# Patient Record
Sex: Female | Born: 1960 | Race: White | Hispanic: No | Marital: Married | Smoking: Never smoker
Health system: Southern US, Community
[De-identification: ages and names within clinical notes are randomized; demographics above are authoritative.]

## PROBLEM LIST (undated history)

## (undated) DIAGNOSIS — R011 Cardiac murmur, unspecified: Secondary | ICD-10-CM

## (undated) DIAGNOSIS — I10 Essential (primary) hypertension: Secondary | ICD-10-CM

## (undated) DIAGNOSIS — F329 Major depressive disorder, single episode, unspecified: Secondary | ICD-10-CM

## (undated) DIAGNOSIS — F419 Anxiety disorder, unspecified: Secondary | ICD-10-CM

## (undated) DIAGNOSIS — M1990 Unspecified osteoarthritis, unspecified site: Secondary | ICD-10-CM

## (undated) DIAGNOSIS — I341 Nonrheumatic mitral (valve) prolapse: Secondary | ICD-10-CM

## (undated) DIAGNOSIS — K219 Gastro-esophageal reflux disease without esophagitis: Secondary | ICD-10-CM

## (undated) DIAGNOSIS — Z8669 Personal history of other diseases of the nervous system and sense organs: Secondary | ICD-10-CM

## (undated) DIAGNOSIS — R42 Dizziness and giddiness: Secondary | ICD-10-CM

## (undated) DIAGNOSIS — T8859XA Other complications of anesthesia, initial encounter: Secondary | ICD-10-CM

## (undated) HISTORY — DX: Major depressive disorder, single episode, unspecified: F32.9

## (undated) HISTORY — PX: HAND SURGERY: SHX662

## (undated) HISTORY — DX: Nonrheumatic mitral (valve) prolapse: I34.1

## (undated) HISTORY — PX: TUBAL LIGATION: SHX77

## (undated) HISTORY — DX: Dizziness and giddiness: R42

## (undated) HISTORY — DX: Anxiety disorder, unspecified: F41.9

## (undated) HISTORY — DX: Personal history of other diseases of the nervous system and sense organs: Z86.69

## (undated) HISTORY — PX: KNEE ARTHROSCOPY: SUR90

---

## 1998-01-02 ENCOUNTER — Emergency Department (HOSPITAL_COMMUNITY): Admission: EM | Admit: 1998-01-02 | Discharge: 1998-01-02 | Payer: Self-pay | Admitting: Emergency Medicine

## 1998-01-03 ENCOUNTER — Encounter: Payer: Self-pay | Admitting: Orthopedic Surgery

## 1998-01-03 ENCOUNTER — Ambulatory Visit (HOSPITAL_BASED_OUTPATIENT_CLINIC_OR_DEPARTMENT_OTHER): Admission: RE | Admit: 1998-01-03 | Discharge: 1998-01-03 | Payer: Self-pay | Admitting: Orthopedic Surgery

## 1999-07-23 ENCOUNTER — Encounter: Payer: Self-pay | Admitting: Emergency Medicine

## 1999-07-23 ENCOUNTER — Emergency Department (HOSPITAL_COMMUNITY): Admission: EM | Admit: 1999-07-23 | Discharge: 1999-07-23 | Payer: Self-pay | Admitting: Emergency Medicine

## 1999-10-08 ENCOUNTER — Encounter: Admission: RE | Admit: 1999-10-08 | Discharge: 1999-10-08 | Payer: Self-pay | Admitting: Family Medicine

## 1999-10-08 ENCOUNTER — Encounter: Payer: Self-pay | Admitting: Family Medicine

## 1999-11-14 ENCOUNTER — Ambulatory Visit (HOSPITAL_BASED_OUTPATIENT_CLINIC_OR_DEPARTMENT_OTHER): Admission: RE | Admit: 1999-11-14 | Discharge: 1999-11-15 | Payer: Self-pay | Admitting: Orthopedic Surgery

## 2003-08-10 ENCOUNTER — Other Ambulatory Visit: Admission: RE | Admit: 2003-08-10 | Discharge: 2003-08-10 | Payer: Self-pay | Admitting: Family Medicine

## 2004-10-11 ENCOUNTER — Encounter: Admission: RE | Admit: 2004-10-11 | Discharge: 2004-10-11 | Payer: Self-pay | Admitting: Family Medicine

## 2005-10-12 ENCOUNTER — Emergency Department (HOSPITAL_COMMUNITY): Admission: EM | Admit: 2005-10-12 | Discharge: 2005-10-12 | Payer: Self-pay | Admitting: Emergency Medicine

## 2005-11-01 ENCOUNTER — Encounter (HOSPITAL_COMMUNITY): Admission: RE | Admit: 2005-11-01 | Discharge: 2005-12-01 | Payer: Self-pay | Admitting: Orthopedic Surgery

## 2005-11-11 ENCOUNTER — Encounter: Admission: RE | Admit: 2005-11-11 | Discharge: 2005-11-11 | Payer: Self-pay | Admitting: Family Medicine

## 2005-12-03 ENCOUNTER — Encounter (HOSPITAL_COMMUNITY): Admission: RE | Admit: 2005-12-03 | Discharge: 2005-12-14 | Payer: Self-pay | Admitting: Orthopedic Surgery

## 2005-12-24 ENCOUNTER — Encounter (HOSPITAL_COMMUNITY): Admission: RE | Admit: 2005-12-24 | Discharge: 2006-01-23 | Payer: Self-pay | Admitting: Orthopedic Surgery

## 2006-01-28 ENCOUNTER — Encounter (HOSPITAL_COMMUNITY): Admission: RE | Admit: 2006-01-28 | Discharge: 2006-02-27 | Payer: Self-pay | Admitting: Orthopedic Surgery

## 2010-01-05 ENCOUNTER — Encounter: Admission: RE | Admit: 2010-01-05 | Discharge: 2010-01-05 | Payer: Self-pay | Admitting: Family Medicine

## 2010-02-21 ENCOUNTER — Other Ambulatory Visit
Admission: RE | Admit: 2010-02-21 | Discharge: 2010-02-21 | Payer: Self-pay | Source: Home / Self Care | Admitting: Family Medicine

## 2010-08-03 NOTE — Op Note (Signed)
[facility]  Patient:    [name], [name]                      MRN: [id number] Proc. Date: 11/14/99 Adm. Date:  [id number] Attending:  Colbert Ewing                           Operative Report  PREOPERATIVE DIAGNOSIS:  Anterior cruciate ligament tear with anterolateral rotary instability of left knee.  Probable lateral meniscus tear.  POSTOPERATIVE DIAGNOSIS:  Anterior cruciate ligament tear with anterolateral rotary instability of left knee.  Radial tear midportion of lateral meniscus. OPERATION PERFORMED:  Left knee examination under anesthesia, arthroscopy, partial lateral meniscectomy.  Arthroscopic endoscopic anterior cruciate ligament reconstruction, middled third patellar tendon, bone-tendon-bone autograft.  Bioabsorbable screw fixation.  Notchplasty.  SURGEON:  Loreta Ave, M.D.  ASSISTANT:  Arlys John D. Petrarca, P.A.-C.  ANESTHESIA:  General.  ESTIMATED BLOOD LOSS:  Minimal.  TOURNIQUET TIME:  One hour.  SPECIMENS:  None.  CULTURES:  None.  COMPLICATIONS:  None.  DRESSING:  Soft compressive with knee immobilizer.  DESCRIPTION OF PROCEDURE:  Patient was brought to the operating room and after adequate anesthesia had been obtained, left knee examined.  Full motion. Positive Lachman, positive drawer, positive pivot shift.  Collaterals stable. Good patellofemoral tracking.  PCL intact.  Tourniquet applied, prepped and draped in the usual sterile fashion.  Exsanguinated with elevation and Esmarch.  Tourniquet inflated to 350 mmHg.  Three portals created, one superolateral, one each medial and lateral parapatellar.  Inflow catheter introduced, knee distended arthroscope introduced and knee inspected. Articular cartilage intact throughout.  Good patellofemoral tracking.  Radial tear midportion lateral meniscus.  Saucerized out with baskets, tapered in smoothly with the shaver.  Medial meniscus had a little scuffing of  the anterior third but no significant tears and it was intact throughout. Articular cartilage, medial and lateral compartment looked good.  ACL had a complete midsubstance tear irreparable.  The ACL debrided.  Notchplasty with shaver and a high speed bur opening this up generously.  PCL intact.  The instruments and fluid were removed.  Anterior ____________ patella to tibial tubercle.  Middle third patellar tendon harvested for a 10 mm graft with bone pegs in either end utilizing oscillating saw and osteotomes.  Graft was prepared for 10 mm tunnels with #5 Ethibond placed at either end and the central portion ____________ with Vicryl.  Defect in the tendon closed with 0 Vicryl.  Arthroscope reintroduced.  Tibial guide seated on the posterior foot print of the ACL and the tibia and the other end through a small vertical incision medial to the tibial tubercle.  K-wire driven, placement found to be good and then overdrilled with a 10 mm reamer.  Debris cleared with a shaver. Femoral guide inserted across the tibial tunnel and notch on the back cortex of femur.  K-wire driven and overdrilled with a reamer for appropriate depth of the graft and pegs.  Debris cleared from all recesses.  Tunnel was assessed and found to be in good position.  A two-pin passer inserted across both tunnels and out through a stab wound in the anterolateral thigh.  Nitenol wire brought in the medial portal and out through the femoral tunnel.  Graft attached to two pin passer and pulled in across the knee seating the pegs well in the tibial and femur.  Fixed over a Nitenol wire with an 8 x 25  bioabsorbable screw in the femur.  Excellent capturing and fixation. The knee was placed in 90 degrees of flexion, posterior drawer applied.  Graft pulled tightly and fixed in the tibia with a Nitenol wire utilizing a 9 x 25 bioabsorbable screw.  At completion I had good capturing and fixation of the pegs at both ends.  Good  clearance of the graft through full motion.  Negative Lachman, negative drawer.  All Nitenol wires and sutures removed.  Wounds were all irrigated and closed with subcutaneous and subcuticular Vicryl.  Portals closed with nylon.  Margins of the wound injected with Marcaine throughout. Sterile compressive dressing applied.  Tourniquet deflated and removed. Anesthesia was then to proceed with placement of a postoperative femoral nerve block.  Once complete, knee immobilizer applied.  Anesthesia reversed. Brought to recovery room.  Tolerated surgery well.  No complications. DD:  11/14/99 TD:  11/15/99 Job: [id number]

## 2010-10-22 ENCOUNTER — Inpatient Hospital Stay (INDEPENDENT_AMBULATORY_CARE_PROVIDER_SITE_OTHER)
Admission: RE | Admit: 2010-10-22 | Discharge: 2010-10-22 | Disposition: A | Discharge status: Home / Self Care | Payer: 59 | Source: Ambulatory Visit | Attending: Emergency Medicine | Admitting: Emergency Medicine

## 2010-10-22 ENCOUNTER — Emergency Department (HOSPITAL_COMMUNITY): Payer: 59

## 2010-10-22 ENCOUNTER — Observation Stay (HOSPITAL_COMMUNITY)
Admission: EM | Admit: 2010-10-22 | Discharge: 2010-10-23 | Disposition: A | Discharge status: Home / Self Care | Payer: 59 | Attending: Internal Medicine | Admitting: Internal Medicine

## 2010-10-22 DIAGNOSIS — R079 Chest pain, unspecified: Secondary | ICD-10-CM

## 2010-10-22 DIAGNOSIS — I059 Rheumatic mitral valve disease, unspecified: Secondary | ICD-10-CM | POA: Insufficient documentation

## 2010-10-22 DIAGNOSIS — R0602 Shortness of breath: Secondary | ICD-10-CM | POA: Insufficient documentation

## 2010-10-22 DIAGNOSIS — R42 Dizziness and giddiness: Secondary | ICD-10-CM | POA: Insufficient documentation

## 2010-10-22 DIAGNOSIS — R0789 Other chest pain: Secondary | ICD-10-CM | POA: Insufficient documentation

## 2010-10-22 DIAGNOSIS — F411 Generalized anxiety disorder: Secondary | ICD-10-CM | POA: Insufficient documentation

## 2010-10-22 DIAGNOSIS — R55 Syncope and collapse: Principal | ICD-10-CM | POA: Insufficient documentation

## 2010-10-22 LAB — DIFFERENTIAL
Lymphocytes Relative: 35 % (ref 12–46)
Monocytes Absolute: 0.5 10*3/uL (ref 0.1–1.0)
Monocytes Relative: 6 % (ref 3–12)
Neutro Abs: 4.1 10*3/uL (ref 1.7–7.7)

## 2010-10-22 LAB — COMPREHENSIVE METABOLIC PANEL WITH GFR
ALT: 17 U/L (ref 0–35)
AST: 22 U/L (ref 0–37)
Albumin: 4.2 g/dL (ref 3.5–5.2)
Alkaline Phosphatase: 88 U/L (ref 39–117)
BUN: 12 mg/dL (ref 6–23)
CO2: 27 meq/L (ref 19–32)
Calcium: 9.8 mg/dL (ref 8.4–10.5)
Chloride: 104 meq/L (ref 96–112)
Creatinine, Ser: 0.56 mg/dL (ref 0.50–1.10)
GFR calc Af Amer: >60 mL/min
GFR calc non Af Amer: >60 mL/min
Glucose, Bld: 89 mg/dL (ref 70–99)
Potassium: 3.9 meq/L (ref 3.5–5.1)
Sodium: 139 meq/L (ref 135–145)
Total Bilirubin: 0.2 mg/dL — ABNORMAL LOW (ref 0.3–1.2)
Total Protein: 7.3 g/dL (ref 6.0–8.3)

## 2010-10-22 LAB — POCT I-STAT TROPONIN I

## 2010-10-22 LAB — CBC
HCT: 41.4 % (ref 36.0–46.0)
MCH: 31.4 pg (ref 26.0–34.0)
MCHC: 35.5 g/dL (ref 30.0–36.0)
Platelets: 287 10*3/uL (ref 150–400)
RBC: 4.68 MIL/uL (ref 3.87–5.11)

## 2010-10-22 LAB — TROPONIN I: Troponin I: <0.3 ng/mL

## 2010-10-22 LAB — D-DIMER, QUANTITATIVE: D-Dimer, Quant: 0.33 ug{FEU}/mL (ref 0.00–0.48)

## 2010-10-23 HISTORY — PX: TRANSTHORACIC ECHOCARDIOGRAM: SHX275

## 2010-10-23 LAB — COMPREHENSIVE METABOLIC PANEL
ALT: 13 U/L (ref 0–35)
Alkaline Phosphatase: 68 U/L (ref 39–117)
BUN: 12 mg/dL (ref 6–23)
CO2: 26 mEq/L (ref 19–32)
Chloride: 107 mEq/L (ref 96–112)
Creatinine, Ser: 0.65 mg/dL (ref 0.50–1.10)
GFR calc Af Amer: >60 mL/min (ref >60)
GFR calc non Af Amer: >60 mL/min (ref >60)
Total Bilirubin: 0.5 mg/dL (ref 0.3–1.2)
Total Protein: 6.2 g/dL (ref 6.0–8.3)

## 2010-10-23 LAB — DIFFERENTIAL
Basophils Absolute: 0 10*3/uL (ref 0.0–0.1)
Eosinophils Absolute: 0.5 10*3/uL (ref 0.0–0.7)
Eosinophils Relative: 9 % — ABNORMAL HIGH (ref 0–5)
Lymphocytes Relative: 40 % (ref 12–46)
Lymphs Abs: 2.4 10*3/uL (ref 0.7–4.0)
Monocytes Absolute: 0.5 10*3/uL (ref 0.1–1.0)
Monocytes Relative: 8 % (ref 3–12)
Neutrophils Relative %: 44 % (ref 43–77)

## 2010-10-23 LAB — CARDIAC PANEL(CRET KIN+CKTOT+MB+TROPI)
CK, MB: 3.6 ng/mL (ref 0.3–4.0)
Relative Index: INVALID (ref 0.0–2.5)
Total CK: 80 U/L (ref 7–177)
Troponin I: <0.3 ng/mL (ref <0.30)

## 2010-10-23 LAB — CBC
MCHC: 34.1 g/dL (ref 30.0–36.0)
Platelets: 264 10*3/uL (ref 150–400)
WBC: 6.1 10*3/uL (ref 4.0–10.5)

## 2010-10-23 LAB — TSH: TSH: 1.537 u[IU]/mL (ref 0.350–4.500)

## 2010-10-25 ENCOUNTER — Encounter: Payer: Self-pay | Admitting: *Deleted
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
## 2010-11-01 ENCOUNTER — Encounter: Payer: Self-pay | Admitting: Cardiology

## 2010-11-01 ENCOUNTER — Ambulatory Visit (HOSPITAL_COMMUNITY): Payer: 59 | Attending: Cardiology | Admitting: Radiology

## 2010-11-01 VITALS — Ht 62.0 in | Wt 132.0 lb

## 2010-11-01 DIAGNOSIS — R079 Chest pain, unspecified: Secondary | ICD-10-CM | POA: Insufficient documentation

## 2010-11-01 DIAGNOSIS — R0602 Shortness of breath: Secondary | ICD-10-CM

## 2010-11-01 DIAGNOSIS — R0789 Other chest pain: Secondary | ICD-10-CM

## 2010-11-01 MED ORDER — TECHNETIUM TC 99M TETROFOSMIN IV KIT
10.9000 | PACK | Freq: Once | INTRAVENOUS | Status: AC | PRN
  Administered 2010-11-01: 10.9 via INTRAVENOUS

## 2010-11-01 MED ORDER — TECHNETIUM TC 99M TETROFOSMIN IV KIT
33.0000 | PACK | Freq: Once | INTRAVENOUS | Status: AC | PRN
  Administered 2010-11-01: 33 via INTRAVENOUS

## 2010-11-01 MED ORDER — REGADENOSON 0.4 MG/5ML IV SOLN
0.4000 mg | Freq: Once | INTRAVENOUS | Status: AC
  Administered 2010-11-01: 0.4 mg via INTRAVENOUS

## 2010-11-01 NOTE — Progress Notes (Signed)
[facility] SITE 3 NUCLEAR MED [address] [place] [address] [phone]  Cardiology Nuclear Med Study  AEON KOORS is a 50 y.o. female [id number] 02-20-1961   Nuclear Med Background Indication for Stress Test:  Evaluation for Ischemia and Post Hospital-10/23/2010 CP, R/o MI  History: 10/23/10 Echo: NL EF 55-65% Cardiac Risk Factors: History of Smoking  Symptoms:  Chest Pain, Dizziness, DOE and Near Syncope   Nuclear Pre-Procedure Caffeine/Decaff Intake:  None NPO After: 7:00pm   Lungs:  clear IV 0.9% NS with Angio Cath:  20g  IV Site: R Hand  IV Started by:  Bonnita Levan, RN  Chest Size (in):  34 Cup Size: B  Height: 5\' 2"  (1.575 m)  Weight:  132 lb (59.875 kg)  BMI:  Body mass index is 24.14 kg/(m^2). Tech Comments:  N/A    Nuclear Med Study 1 or 2 day study: 1 day  Stress Test Type:  Stress  Reading MD: Kristeen Miss, MD  Order Authorizing Provider:  T.Brackbill  Resting Radionuclide: Technetium 61m Tetrofosmin  Resting Radionuclide Dose: 10.9 mCi   Stress Radionuclide:  Technetium 36m Tetrofosmin  Stress Radionuclide Dose: 33.0 mCi           Stress Protocol Rest HR: 62 Stress HR: 146  Rest BP: 120/69 Stress BP: 131/64  Exercise Time (min): 9:19 METS: 8.9   Predicted Max HR: 173 bpm % Max HR: 84.39 bpm Rate Pressure Product: 21308   Dose of Adenosine (mg):  n/a Dose of Lexiscan: n/a mg  Dose of Atropine (mg): n/a Dose of Dobutamine: n/a mcg/kg/min (at max HR)  Stress Test Technologist: Milana Na, EMT-P  Nuclear Technologist:  Domenic Polite, CNMT     Rest Procedure:  Myocardial perfusion imaging was performed at rest 45 minutes following the intravenous administration of Technetium 13m Tetrofosmin. Rest ECG: NSR  Stress Procedure:  The patient exercised for 9:19.  The patient stopped due to sob and chest tightnes.  There were no significant ST-T wave changes and rare pvcs.  Technetium 72m Tetrofosmin was injected at peak  exercise and myocardial perfusion imaging was performed after a brief delay. Stress ECG: No significant ST segment change suggestive of ischemia.  QPS Raw Data Images:  There is no interference from nuclear activity from structures below the diaphragm.  This does not affect the ability to read the study. Stress Images:  Normal homogeneous uptake in all areas of the myocardium. Rest Images:  Normal homogeneous uptake in all areas of the myocardium. Subtraction (SDS):  No evidence of ischemia. Transient Ischemic Dilatation (Normal <1.22):  1.06 Lung/Heart Ratio (Normal <0.45):  0.31  Quantitative Gated Spect Images QGS EDV:  62 ml QGS ESV:  17 ml QGS cine images:  NL LV Function; NL Wall Motion QGS EF: 72%  Impression Exercise Capacity:  Excellent exercise capacity. BP Response:  Normal blood pressure response. Clinical Symptoms:  Mild chest pain/dyspnea. ECG Impression:  No significant ST segment change suggestive of ischemia. Comparison with Prior Nuclear Study: No images to compare  Overall Impression:  Normal stress nuclear study.  No evidence of ischemia.  Normal LV function.    Vesta Mixer, Montez Hageman., MD, [facility]

## 2010-11-05 ENCOUNTER — Telehealth: Payer: Self-pay | Admitting: *Deleted

## 2010-11-05 NOTE — Telephone Encounter (Signed)
Advised of stress test results 

## 2010-11-05 NOTE — Telephone Encounter (Signed)
Message copied by Eugenia Pancoast on Mon Nov 05, 2010 12:07 PM ------      Message from: Cassell Clement      Created: Sat Nov 03, 2010  8:16 PM       Please report.  Stress test normal. [Note this is a different Ondria Lamarca---Not Nadine Counts Hollenback's wife]

## 2010-11-15 ENCOUNTER — Ambulatory Visit (INDEPENDENT_AMBULATORY_CARE_PROVIDER_SITE_OTHER): Payer: 59 | Admitting: Cardiology

## 2010-11-15 ENCOUNTER — Encounter: Payer: Self-pay | Admitting: Cardiology

## 2010-11-15 VITALS — BP 130/80 | HR 60 | Wt 134.0 lb

## 2010-11-15 DIAGNOSIS — I059 Rheumatic mitral valve disease, unspecified: Secondary | ICD-10-CM

## 2010-11-15 DIAGNOSIS — I341 Nonrheumatic mitral (valve) prolapse: Secondary | ICD-10-CM | POA: Insufficient documentation

## 2010-11-15 DIAGNOSIS — F32A Depression, unspecified: Secondary | ICD-10-CM

## 2010-11-15 DIAGNOSIS — F329 Major depressive disorder, single episode, unspecified: Secondary | ICD-10-CM | POA: Insufficient documentation

## 2010-11-15 HISTORY — DX: Depression, unspecified: F32.A

## 2010-11-15 MED ORDER — METOPROLOL TARTRATE 25 MG PO TABS: ORAL_TABLET | ORAL | Status: DC

## 2010-11-15 NOTE — Assessment & Plan Note (Signed)
The patient had a remote history of mitral valve prolapse which was confirmed during her recent hospital stay.  Although she had rather marked anterior leaflet prolapse she had only mild mitral regurgitation.

## 2010-11-15 NOTE — Progress Notes (Signed)
[name] Date of Birth:  Jan 10, 1961 [facility] [address]   [address] [place], [place]  [address] [phone]           Fax   [phone]  History of Present Illness: This pleasant 50 year old woman is seen by me for the first time in the office today.  She is a medical patient of Dr. Maurice Small.  The patient was recently hospitalized at [facility] for presyncope and dizziness and atypical chest pain.  It was felt that her presyncope was likely secondary to mild dehydration.  She did not have any evidence of myocardial injury by enzymes.  She was noted to have a prior history of mitral valve prolapse and history of anxiety.  In the hospital she had a normal chest x-ray and she had an echocardiogram which showed normal ejection fraction of 55-60% with no regional wall motion abnormalities and she had moderate to severe prolapse of the anterior mitral valve leaflets with only trace to mild mitral regurgitation.  Her left atrium was normal in size.  Following discharge the patient had an treadmill Cardiolite stress test on 11/01/10 which showed normal ejection fraction of 72% and no evidence of ischemia.  The patient walked for more than 9 minutes on the treadmill.  She comes in now for followup from a heart standpoint.The patient has had no recurrent chest pain or presyncopal episodes since discharge  Current Outpatient Prescriptions  Medication Sig Dispense Refill  . aspirin 81 MG EC tablet Take 81 mg by mouth daily.        . sertraline (ZOLOFT) 100 MG tablet Take 100 mg by mouth daily.        . metoprolol tartrate (LOPRESSOR) 25 MG tablet Take one daily as needed for palpitations or chest pain.  30 tablet  11    No Known Allergies  There is no problem list on file for this patient.   History  Smoking status  . Never Smoker   Smokeless tobacco  . Not on file    History  Alcohol Use No    No family history on file.  Review of  Systems: Constitutional: no fever chills diaphoresis or fatigue or change in weight.  Head and neck: no hearing loss, no epistaxis, no photophobia or visual disturbance. Respiratory: No cough, shortness of breath or wheezing. Cardiovascular: No chest pain peripheral edema, palpitations. Gastrointestinal: No abdominal distention, no abdominal pain, no change in bowel habits hematochezia or melena. Genitourinary: No dysuria, no frequency, no urgency, no nocturia. Musculoskeletal:No arthralgias, no back pain, no gait disturbance or myalgias. Neurological: No dizziness, no headaches, no numbness, no seizures, no syncope, no weakness, no tremors. Hematologic: No lymphadenopathy, no easy bruising. Psychiatric: No confusion, no hallucinations, no sleep disturbance.    Physical Exam: Filed Vitals:   11/15/10 1458  BP: 130/80  Pulse: 60  The general appearance reveals a well-developed well-nourished woman in no distress.Pupils equal and reactive.   Extraocular Movements are full.  There is no scleral icterus.  The mouth and pharynx are normal.  The neck is supple.  The carotids reveal no bruits.  The jugular venous pressure is normal.  The thyroid is not enlarged.  There is no lymphadenopathy.  The chest is clear to percussion and auscultation. There are no rales or rhonchi. Expansion of the chest is symmetrical.    The heart reveals no murmur gallop rub or click.  We listened in the supine, left lateral decubitus, and  upright position but I don't hear any convincing murmur of mitral valve prolapse. The abdomen is soft and nontender. Bowel sounds are normal. The liver and spleen are not enlarged. There Are no abdominal masses. There are no bruits.  The pedal pulses are good.  There is no phlebitis or edema.  There is no cyanosis or clubbing.  Strength is normal and symmetrical in all extremities.  There is no lateralizing weakness.  There are no sensory deficits.  EKG today shows normal sinus  rhythm and is within normal limits.   Assessment / Plan: My impression is that the patient does have mild mitral valve prolapse by echo with mild mitral regurgitation.  She's not having any current symptoms referable to her heart.  I don't believe that she needs to be on any daily medication for her heart.  I did give her a prescription for metoprolol 25 mg to have on hand for p.r.n. Use should she have future problems with palpitations or tachycardia or chest pain.  She is to continue under the medical care of Dr. Valentina Lucks and we will be happy to see her again p.r.n. If further cardiac problems or symptoms occur.

## 2010-11-15 NOTE — Assessment & Plan Note (Signed)
The patient has a past history of depression which has been stable on her current dose of generic Zoloft 100 mg daily

## 2010-11-16 ENCOUNTER — Encounter: Payer: Self-pay | Admitting: Cardiology
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
## 2012-06-16 ENCOUNTER — Other Ambulatory Visit: Payer: Self-pay | Admitting: Gastroenterology

## 2013-11-12 ENCOUNTER — Other Ambulatory Visit (HOSPITAL_COMMUNITY)
Admission: RE | Admit: 2013-11-12 | Discharge: 2013-11-12 | Disposition: A | Discharge status: Home / Self Care | Payer: 59 | Source: Ambulatory Visit | Attending: Family Medicine | Admitting: Family Medicine

## 2013-11-12 ENCOUNTER — Other Ambulatory Visit: Payer: Self-pay | Admitting: Family Medicine

## 2013-11-12 DIAGNOSIS — Z124 Encounter for screening for malignant neoplasm of cervix: Secondary | ICD-10-CM | POA: Diagnosis not present

## 2013-11-17 LAB — CYTOLOGY - PAP

## 2015-10-18 ENCOUNTER — Other Ambulatory Visit: Payer: Self-pay | Admitting: Radiology

## 2016-03-18 DIAGNOSIS — I471 Supraventricular tachycardia, unspecified: Secondary | ICD-10-CM

## 2016-03-18 DIAGNOSIS — R002 Palpitations: Secondary | ICD-10-CM

## 2016-03-18 HISTORY — DX: Supraventricular tachycardia, unspecified: I47.10

## 2016-03-18 HISTORY — DX: Palpitations: R00.2

## 2016-03-18 HISTORY — DX: Supraventricular tachycardia: I47.1

## 2016-04-18 DIAGNOSIS — N6322 Unspecified lump in the left breast, upper inner quadrant: Secondary | ICD-10-CM | POA: Diagnosis not present

## 2016-04-18 DIAGNOSIS — N6002 Solitary cyst of left breast: Secondary | ICD-10-CM | POA: Diagnosis not present

## 2016-07-01 DIAGNOSIS — I10 Essential (primary) hypertension: Secondary | ICD-10-CM | POA: Diagnosis not present

## 2016-07-29 DIAGNOSIS — I10 Essential (primary) hypertension: Secondary | ICD-10-CM | POA: Diagnosis not present

## 2016-10-02 ENCOUNTER — Other Ambulatory Visit: Payer: Self-pay | Admitting: Family Medicine

## 2016-10-02 ENCOUNTER — Ambulatory Visit
Admission: RE | Admit: 2016-10-02 | Discharge: 2016-10-02 | Disposition: A | Discharge status: Home / Self Care | Payer: 59 | Source: Ambulatory Visit | Attending: Family Medicine | Admitting: Family Medicine

## 2016-10-02 DIAGNOSIS — M25541 Pain in joints of right hand: Secondary | ICD-10-CM

## 2016-10-02 DIAGNOSIS — M25542 Pain in joints of left hand: Principal | ICD-10-CM

## 2016-10-02 DIAGNOSIS — M7989 Other specified soft tissue disorders: Secondary | ICD-10-CM | POA: Diagnosis not present

## 2016-10-02 DIAGNOSIS — M2550 Pain in unspecified joint: Secondary | ICD-10-CM | POA: Diagnosis not present

## 2016-10-23 DIAGNOSIS — S61512D Laceration without foreign body of left wrist, subsequent encounter: Secondary | ICD-10-CM | POA: Diagnosis not present

## 2016-10-24 DIAGNOSIS — Z09 Encounter for follow-up examination after completed treatment for conditions other than malignant neoplasm: Secondary | ICD-10-CM | POA: Diagnosis not present

## 2016-10-24 DIAGNOSIS — N6002 Solitary cyst of left breast: Secondary | ICD-10-CM | POA: Diagnosis not present

## 2016-10-24 DIAGNOSIS — N6019 Diffuse cystic mastopathy of unspecified breast: Secondary | ICD-10-CM | POA: Diagnosis not present

## 2016-11-25 DIAGNOSIS — M79642 Pain in left hand: Secondary | ICD-10-CM | POA: Diagnosis not present

## 2016-11-25 DIAGNOSIS — M79641 Pain in right hand: Secondary | ICD-10-CM | POA: Diagnosis not present

## 2016-12-10 DIAGNOSIS — G5603 Carpal tunnel syndrome, bilateral upper limbs: Secondary | ICD-10-CM | POA: Diagnosis not present

## 2016-12-10 DIAGNOSIS — M79641 Pain in right hand: Secondary | ICD-10-CM | POA: Diagnosis not present

## 2016-12-10 DIAGNOSIS — M79642 Pain in left hand: Secondary | ICD-10-CM | POA: Diagnosis not present

## 2016-12-10 DIAGNOSIS — M180 Bilateral primary osteoarthritis of first carpometacarpal joints: Secondary | ICD-10-CM | POA: Diagnosis not present

## 2016-12-13 ENCOUNTER — Other Ambulatory Visit (HOSPITAL_COMMUNITY)
Admission: RE | Admit: 2016-12-13 | Discharge: 2016-12-13 | Disposition: A | Discharge status: Home / Self Care | Payer: 59 | Source: Ambulatory Visit | Attending: Family Medicine | Admitting: Family Medicine

## 2016-12-13 ENCOUNTER — Other Ambulatory Visit: Payer: Self-pay | Admitting: Family Medicine

## 2016-12-13 DIAGNOSIS — Z124 Encounter for screening for malignant neoplasm of cervix: Secondary | ICD-10-CM | POA: Diagnosis not present

## 2016-12-13 DIAGNOSIS — I10 Essential (primary) hypertension: Secondary | ICD-10-CM | POA: Diagnosis not present

## 2016-12-13 DIAGNOSIS — M1990 Unspecified osteoarthritis, unspecified site: Secondary | ICD-10-CM | POA: Diagnosis not present

## 2016-12-13 DIAGNOSIS — N959 Unspecified menopausal and perimenopausal disorder: Secondary | ICD-10-CM | POA: Diagnosis not present

## 2016-12-13 DIAGNOSIS — G5600 Carpal tunnel syndrome, unspecified upper limb: Secondary | ICD-10-CM | POA: Diagnosis not present

## 2016-12-13 DIAGNOSIS — Z0000 Encounter for general adult medical examination without abnormal findings: Secondary | ICD-10-CM | POA: Diagnosis not present

## 2016-12-17 LAB — CYTOLOGY - PAP
Diagnosis: NEGATIVE
HPV (WINDOPATH): NOT DETECTED

## 2017-01-07 DIAGNOSIS — G5603 Carpal tunnel syndrome, bilateral upper limbs: Secondary | ICD-10-CM | POA: Diagnosis not present

## 2017-01-07 DIAGNOSIS — M79641 Pain in right hand: Secondary | ICD-10-CM | POA: Diagnosis not present

## 2017-01-07 DIAGNOSIS — M79642 Pain in left hand: Secondary | ICD-10-CM | POA: Diagnosis not present

## 2017-01-17 ENCOUNTER — Emergency Department (HOSPITAL_COMMUNITY)
Admission: EM | Admit: 2017-01-17 | Discharge: 2017-01-17 | Disposition: A | Discharge status: Home / Self Care | Payer: 59 | Attending: Emergency Medicine | Admitting: Emergency Medicine

## 2017-01-17 ENCOUNTER — Emergency Department (HOSPITAL_COMMUNITY): Payer: 59

## 2017-01-17 DIAGNOSIS — Z7982 Long term (current) use of aspirin: Secondary | ICD-10-CM | POA: Insufficient documentation

## 2017-01-17 DIAGNOSIS — Z79899 Other long term (current) drug therapy: Secondary | ICD-10-CM | POA: Diagnosis not present

## 2017-01-17 DIAGNOSIS — I471 Supraventricular tachycardia: Secondary | ICD-10-CM | POA: Insufficient documentation

## 2017-01-17 DIAGNOSIS — R079 Chest pain, unspecified: Secondary | ICD-10-CM | POA: Diagnosis not present

## 2017-01-17 DIAGNOSIS — R002 Palpitations: Secondary | ICD-10-CM | POA: Diagnosis present

## 2017-01-17 LAB — CBC WITH DIFFERENTIAL/PLATELET
BASOS ABS: 0 10*3/uL (ref 0.0–0.1)
Basophils Relative: 0 %
EOS ABS: 0.1 10*3/uL (ref 0.0–0.7)
EOS PCT: 1 %
HCT: 45.7 % (ref 36.0–46.0)
HEMOGLOBIN: 16 g/dL — AB (ref 12.0–15.0)
Lymphocytes Relative: 45 %
Lymphs Abs: 4.4 10*3/uL — ABNORMAL HIGH (ref 0.7–4.0)
MCH: 31.4 pg (ref 26.0–34.0)
MCHC: 35 g/dL (ref 30.0–36.0)
MCV: 89.6 fL (ref 78.0–100.0)
Monocytes Absolute: 0.6 10*3/uL (ref 0.1–1.0)
Monocytes Relative: 6 %
NEUTROS PCT: 48 %
Neutro Abs: 4.6 10*3/uL (ref 1.7–7.7)
PLATELETS: 300 10*3/uL (ref 150–400)
RBC: 5.1 MIL/uL (ref 3.87–5.11)
RDW: 12.4 % (ref 11.5–15.5)
WBC: 9.7 10*3/uL (ref 4.0–10.5)

## 2017-01-17 LAB — I-STAT TROPONIN, ED: Troponin i, poc: 0.01 ng/mL (ref 0.00–0.08)

## 2017-01-17 LAB — COMPREHENSIVE METABOLIC PANEL
ALK PHOS: 89 U/L (ref 38–126)
ALT: 29 U/L (ref 14–54)
AST: 40 U/L (ref 15–41)
Albumin: 4.6 g/dL (ref 3.5–5.0)
Anion gap: 12 (ref 5–15)
BUN: 12 mg/dL (ref 6–20)
CALCIUM: 10.1 mg/dL (ref 8.9–10.3)
CHLORIDE: 101 mmol/L (ref 101–111)
CO2: 23 mmol/L (ref 22–32)
CREATININE: 0.9 mg/dL (ref 0.44–1.00)
GFR calc non Af Amer: >60 mL/min (ref >60)
Glucose, Bld: 167 mg/dL — ABNORMAL HIGH (ref 65–99)
Potassium: 3.6 mmol/L (ref 3.5–5.1)
SODIUM: 136 mmol/L (ref 135–145)
Total Bilirubin: 1 mg/dL (ref 0.3–1.2)
Total Protein: 7.8 g/dL (ref 6.5–8.1)

## 2017-01-17 MED ORDER — ADENOSINE 6 MG/2ML IV SOLN
INTRAVENOUS | Status: AC
  Filled 2017-01-17: qty 6

## 2017-01-17 MED ORDER — ADENOSINE 6 MG/2ML IV SOLN
6.0000 mg | Freq: Once | INTRAVENOUS | Status: AC
  Administered 2017-01-17: 6 mg via INTRAVENOUS

## 2017-01-17 NOTE — ED Triage Notes (Signed)
Pt to ER for sudden onset of central chest pressure while walking in the park with her grandkids, EKG showing SVT @ 225 bpm. Pt reports SOB.

## 2017-01-17 NOTE — ED Notes (Signed)
Per Dr. Estell HarpinZammit, pt able to have something to drink if HR has remained stable. Pt given ice water and gingerale.

## 2017-01-17 NOTE — ED Notes (Signed)
ED Provider at bedside. 

## 2017-01-17 NOTE — ED Notes (Signed)
Pt verbalized understanding of d.c instructions, no further questions. Pt a.o, ambulatory upon d.c 

## 2017-01-17 NOTE — Discharge Instructions (Signed)
Follow-up with your doctor in 1-2 weeks.  Return if any more problems

## 2017-01-17 NOTE — ED Provider Notes (Signed)
[facility] Provider Note   CSN: [id number] Arrival date & time: 01/17/17  1119     History   Chief Complaint Chief Complaint  Patient presents with  . Chest Pain  . Tachycardia    HPI Doree FudgeLouise D Percival is a 56 y.o. female.  Patient states that she feels like her heart is racing and she feels weak   The history is provided by the patient.  Palpitations   This is a new problem. The current episode started less than 1 hour ago. The problem occurs constantly. The problem has not changed since onset.The problem is associated with an unknown factor. Pertinent negatives include no fever, no chest pain, no abdominal pain, no headaches, no back pain and no cough.    Past Medical History:  Diagnosis Date  . Anxiety   . Chest pain   . Dizziness   . History of diplopia   . MVP (mitral valve prolapse)     Patient Active Problem List   Diagnosis Date Noted  . Mitral valve prolapse 11/15/2010  . Depression 11/15/2010    Past Surgical History:  Procedure Laterality Date  . CESAREAN SECTION    . TRANSTHORACIC ECHOCARDIOGRAM  10/23/2010   EF 55-60%  . TUBAL LIGATION      OB History    No data available       Home Medications    Prior to Admission medications   Medication Sig Start Date End Date Taking? Authorizing Provider  hydrochlorothiazide (HYDRODIURIL) 12.5 MG tablet Take 12.5 mg by mouth daily. 12/09/16  Yes [provider]  meloxicam (MOBIC) 7.5 MG tablet Take 7.5 mg by mouth daily.   Yes [provider]  aspirin 81 MG EC tablet Take 81 mg by mouth daily.      [provider]  metoprolol tartrate (LOPRESSOR) 25 MG tablet Take one daily as needed for palpitations or chest pain. Patient not taking: Reported on 01/17/2017 11/15/10   Cassell ClementBrackbill, Thomas, MD  sertraline (ZOLOFT) 100 MG tablet Take 100 mg by mouth daily.      [provider]    Family History No family history on file.  Social  History Social History  Substance Use Topics  . Smoking status: Never Smoker  . Smokeless tobacco: Not on file  . Alcohol use No     Allergies   Patient has no known allergies.   Review of Systems Review of Systems  Constitutional: Negative for appetite change, fatigue and fever.  HENT: Negative for congestion, ear discharge and sinus pressure.   Eyes: Negative for discharge.  Respiratory: Negative for cough.   Cardiovascular: Positive for palpitations. Negative for chest pain.  Gastrointestinal: Negative for abdominal pain and diarrhea.  Genitourinary: Negative for frequency and hematuria.  Musculoskeletal: Negative for back pain.  Skin: Negative for rash.  Neurological: Negative for seizures and headaches.  Psychiatric/Behavioral: Negative for hallucinations.     Physical Exam Updated Vital Signs BP 116/84   Pulse 88   Resp 20   SpO2 98%   Physical Exam  Constitutional: She is oriented to person, place, and time. She appears well-developed.  HENT:  Head: Normocephalic.  Eyes: Conjunctivae and EOM are normal. No scleral icterus.  Neck: Neck supple. No thyromegaly present.  Cardiovascular: Exam reveals no gallop and no friction rub.   No murmur heard. Rapid regular rate  Pulmonary/Chest: No stridor. She has no wheezes. She has no rales. She exhibits no tenderness.  Abdominal: She exhibits no  distension. There is no tenderness. There is no rebound.  Musculoskeletal: Normal range of motion. She exhibits no edema.  Lymphadenopathy:    She has no cervical adenopathy.  Neurological: She is oriented to person, place, and time. She exhibits normal muscle tone. Coordination normal.  Skin: No rash noted. No erythema.  Psychiatric: She has a normal mood and affect. Her behavior is normal.     ED Treatments / Results  Labs (all labs ordered are listed, but only abnormal results are displayed) Labs Reviewed  CBC WITH DIFFERENTIAL/PLATELET - Abnormal; Notable for the  following:       Result Value   Hemoglobin 16.0 (*)    Lymphs Abs 4.4 (*)    All other components within normal limits  COMPREHENSIVE METABOLIC PANEL - Abnormal; Notable for the following:    Glucose, Bld 167 (*)    All other components within normal limits  I-STAT TROPONIN, ED    EKG  EKG Interpretation None       Radiology Dg Chest Port 1 View  Result Date: 01/17/2017 CLINICAL DATA:  56 year old female with a history of chest pain and tightness EXAM: PORTABLE CHEST 1 VIEW COMPARISON:  10/22/2010, 01/05/2010 FINDINGS: Cardiomediastinal silhouette unchanged in size and contour. Defibrillator pads project over the lower chest. No evidence of central vascular congestion. No interlobular septal thickening. No confluent airspace disease. No pneumothorax. No pleural effusion. IMPRESSION: No radiographic evidence of acute cardiopulmonary disease. Defibrillator pads project over the lower chest. Electronically Signed   By: Gilmer Mor D.O.   On: 01/17/2017 12:26    Procedures Procedures (including critical care time)  Medications Ordered in ED Medications  adenosine (ADENOCARD) 6 MG/2ML injection (not administered)  adenosine (ADENOCARD) 6 MG/2ML injection 6 mg (6 mg Intravenous Given 01/17/17 1138)     Initial Impression / Assessment and Plan / ED Course  I have reviewed the triage vital signs and the nursing notes.  Pertinent labs & imaging results that were available during my care of the patient were reviewed by me and considered in my medical decision making (see chart for details). CRITICAL CARE Performed by: Jaleea Alesi L Total critical care time: 35 minutes Critical care time was exclusive of separately billable procedures and treating other patients. Critical care was necessary to treat or prevent imminent or life-threatening deterioration. Critical care was time spent personally by me on the following activities: development of treatment plan with patient and/or  surrogate as well as nursing, discussions with consultants, evaluation of patient's response to treatment, examination of patient, obtaining history from patient or surrogate, ordering and performing treatments and interventions, ordering and review of laboratory studies, ordering and review of radiographic studies, pulse oximetry and re-evaluation of patient's condition.   Patient had an SVT of right greater than 200.  Patient responded to adenosine 6 mg IV.  Patient will follow up with her primary care doctor.  This is her first episode of SVT   Final Clinical Impressions(s) / ED Diagnoses   Final diagnoses:  SVT (supraventricular tachycardia) (HCC)    New Prescriptions New Prescriptions   No medications on file     Bethann Berkshire, MD 01/17/17 1521

## 2017-01-17 NOTE — ED Notes (Signed)
EDP at bedside  

## 2017-01-20 DIAGNOSIS — I471 Supraventricular tachycardia: Secondary | ICD-10-CM | POA: Diagnosis not present

## 2017-01-21 ENCOUNTER — Telehealth: Payer: Self-pay

## 2017-01-21 NOTE — Progress Notes (Signed)
Cardiology Office Note   Date:  01/23/2017   ID:  Carol FudgeLouise D Cory, DOB 06/21/1960, MRN [id number]  PCP:  Maurice SmallGriffin, Elaine, MD  Cardiologist:   Charlton HawsPeter Nishan, MD   No chief complaint on file.     History of Present Illness: [name] is a 56 y.o. female who presents for consultation regarding SVT. Referred by Dr Valentina LucksGriffin and Dr Glorianne ManchesterZamit Brewster. Last seen by Dr Patty SermonsBrackbill cardiology 2012 Had pre syncope and atypical chest pain Echo was normal EF 55-60% She had anterior leaflet prolapse but only trace MR Myovue was normal EF 72%  Seen in ER 01/17/17 with SVT HR in 200 bpm range Converted with Adenosine D/C for outpatient f/u with no med changes Some associated atypical chest pian troponin negative Hct 45.7 K 3.6 CXR NAD. No recent TSH in Epic  She was pushing grand kids in stroller when it happened Chest tightness and dyspnea only when  In SVT went away when she converted     Past Medical History:  Diagnosis Date  . Anxiety   . Chest pain   . Depression 11/15/2010  . Dizziness   . History of diplopia   . MVP (mitral valve prolapse)     Past Surgical History:  Procedure Laterality Date  . CESAREAN SECTION    . TRANSTHORACIC ECHOCARDIOGRAM  10/23/2010   EF 55-60%  . TUBAL LIGATION       Current Outpatient Medications  Medication Sig Dispense Refill  . hydrochlorothiazide (HYDRODIURIL) 12.5 MG tablet Take 12.5 mg by mouth daily.    . meloxicam (MOBIC) 7.5 MG tablet Take 7.5 mg by mouth daily.     No current facility-administered medications for this visit.     Allergies:   Patient has no known allergies.    Social History:  The patient  reports that  has never smoked. she has never used smokeless tobacco. She reports that she does not drink alcohol or use drugs.   Family History:  The patient's family history is not on file.    ROS:  Please see the history of present illness.   Otherwise, review of systems are positive for none.   All other systems are reviewed and  negative.    PHYSICAL EXAM: VS:  BP 126/82   Pulse 71   Ht 5\' 2"  (1.575 m)   Wt 139 lb 4 oz (63.2 kg)   SpO2 99%   BMI 25.47 kg/m  , BMI Body mass index is 25.47 kg/m. Affect appropriate Healthy:  appears stated age HEENT: normal Neck supple with no adenopathy JVP normal no bruits no thyromegaly Lungs clear with no wheezing and good diaphragmatic motion Heart:  S1/S2 no murmur, no rub, gallop or click PMI normal Abdomen: benighn, BS positve, no tenderness, no AAA no bruit.  No HSM or HJR Distal pulses intact with no bruits No edema Neuro non-focal Skin warm and dry No muscular weakness    EKG:  SVT vs flutter rate 222 no pre excitation 01/17/17    Recent Labs: 01/17/2017: ALT 29; BUN 12; Creatinine, Ser 0.90; Hemoglobin 16.0; Platelets 300; Potassium 3.6; Sodium 136    Lipid Panel No results found for: CHOL, TRIG, HDL, CHOLHDL, VLDL, LDLCALC, LDLDIRECT    Wt Readings from Last 3 Encounters:  01/23/17 139 lb 4 oz (63.2 kg)  11/15/10 134 lb (60.8 kg)  11/01/10 132 lb (59.9 kg)      Other studies Reviewed: Additional studies/ records that were reviewed today include: Notes  DR Patty SermonsBrackbill and echo 2012 ER notes labs ECG and CXR 01/17/17 .    ASSESSMENT AND PLAN:  1.  Tachycardia:  SVT vs flutter broke with adenosine suggests re entry and SVT. PRN Inderal 20 mg given F/U ETT and echo r/o structural heart disease and check TSH/T4 PRN f/u if recurs will need to see EP for discussion of ablation  2. MVP:  No murmur on exam history of f/u echo see #1    Current medicines are reviewed at length with the patient today.  The patient does not have concerns regarding medicines.  The following changes have been made:  PRN Inderal 20 mg   Labs/ tests ordered today include: TSH/T4  Echo  ETT  No orders of the defined types were placed in this encounter.    Disposition:   FU with EP next available      Signed, Charlton HawsPeter Nishan, MD  01/23/2017 4:46 PM    [facility] [address], [place], [place]  [address] Phone: [phone]; Fax: [phone]

## 2017-01-21 NOTE — Telephone Encounter (Signed)
Sent notes to scheduling 

## 2017-01-23 ENCOUNTER — Encounter: Payer: Self-pay | Admitting: Cardiovascular Disease

## 2017-01-23 ENCOUNTER — Ambulatory Visit: Payer: 59 | Admitting: Cardiovascular Disease

## 2017-01-23 VITALS — BP 126/82 | HR 71 | Ht 62.0 in | Wt 139.2 lb

## 2017-01-23 DIAGNOSIS — I471 Supraventricular tachycardia: Secondary | ICD-10-CM | POA: Diagnosis not present

## 2017-01-23 DIAGNOSIS — R002 Palpitations: Secondary | ICD-10-CM | POA: Diagnosis not present

## 2017-01-23 MED ORDER — PROPRANOLOL HCL 20 MG PO TABS: 20.0000 mg | ORAL_TABLET | Freq: Every day | ORAL | 3 refills | Status: DC | PRN

## 2017-01-23 NOTE — Patient Instructions (Addendum)
Medication Instructions:  Your physician has recommended you make the following change in your medication:  1-START Inderal 20 mg by mouth as needed for elevated heart over 100  Labwork: Your physician recommends that you have lab work today- TSH and T4  Testing/Procedures: Your physician has requested that you have an echocardiogram. Echocardiography is a painless test that uses sound waves to create images of your heart. It provides your doctor with information about the size and shape of your heart and how well your heart's chambers and valves are working. This procedure takes approximately one hour. There are no restrictions for this procedure.  Your physician has requested that you have an exercise tolerance test. For further information please visit [email or web address]. Please also follow instruction sheet, as given.  Follow-Up: Your physician wants you to follow-up as needed with Dr. Eden EmmsNishan.   If you need a refill on your cardiac medications before your next appointment, please call your pharmacy.

## 2017-01-24 LAB — TSH: TSH: 1.01 u[IU]/mL (ref 0.450–4.500)

## 2017-01-24 LAB — T4, FREE: Free T4: 1.47 ng/dL (ref 0.82–1.77)

## 2017-02-11 ENCOUNTER — Ambulatory Visit (HOSPITAL_COMMUNITY): Payer: 59 | Attending: Cardiology

## 2017-02-11 ENCOUNTER — Other Ambulatory Visit: Payer: Self-pay

## 2017-02-11 ENCOUNTER — Ambulatory Visit (INDEPENDENT_AMBULATORY_CARE_PROVIDER_SITE_OTHER): Payer: 59

## 2017-02-11 DIAGNOSIS — I471 Supraventricular tachycardia: Secondary | ICD-10-CM | POA: Diagnosis not present

## 2017-02-11 DIAGNOSIS — R002 Palpitations: Secondary | ICD-10-CM | POA: Diagnosis not present

## 2017-02-11 LAB — EXERCISE TOLERANCE TEST
CSEPEW: 10.1 METS
CSEPHR: 89 %
CSEPPHR: 148 {beats}/min
Exercise duration (min): 9 min
Exercise duration (sec): 0 s
MPHR: 166 {beats}/min
RPE: 17
Rest HR: 67 {beats}/min

## 2017-04-28 DIAGNOSIS — Z1231 Encounter for screening mammogram for malignant neoplasm of breast: Secondary | ICD-10-CM | POA: Diagnosis not present

## 2017-05-05 DIAGNOSIS — R922 Inconclusive mammogram: Secondary | ICD-10-CM | POA: Diagnosis not present

## 2017-05-05 DIAGNOSIS — R921 Mammographic calcification found on diagnostic imaging of breast: Secondary | ICD-10-CM | POA: Diagnosis not present

## 2017-06-16 DIAGNOSIS — I10 Essential (primary) hypertension: Secondary | ICD-10-CM | POA: Diagnosis not present

## 2017-06-16 DIAGNOSIS — M1990 Unspecified osteoarthritis, unspecified site: Secondary | ICD-10-CM | POA: Diagnosis not present

## 2017-06-16 DIAGNOSIS — I471 Supraventricular tachycardia: Secondary | ICD-10-CM | POA: Diagnosis not present

## 2017-08-01 DIAGNOSIS — J329 Chronic sinusitis, unspecified: Secondary | ICD-10-CM | POA: Diagnosis not present

## 2017-08-01 DIAGNOSIS — J309 Allergic rhinitis, unspecified: Secondary | ICD-10-CM | POA: Diagnosis not present

## 2017-09-22 DIAGNOSIS — Z86010 Personal history of colonic polyps: Secondary | ICD-10-CM | POA: Diagnosis not present

## 2017-09-22 DIAGNOSIS — K640 First degree hemorrhoids: Secondary | ICD-10-CM | POA: Diagnosis not present

## 2017-12-10 IMAGING — DX DG CHEST 1V PORT
1 series · 1 of 1 positions shown · non-contrast
Comparison: 10/22/2010, 01/05/2010

CLINICAL DATA: 56-year-old female with a history of chest pain and
tightness

EXAM:
PORTABLE CHEST 1 VIEW

[chest ap]
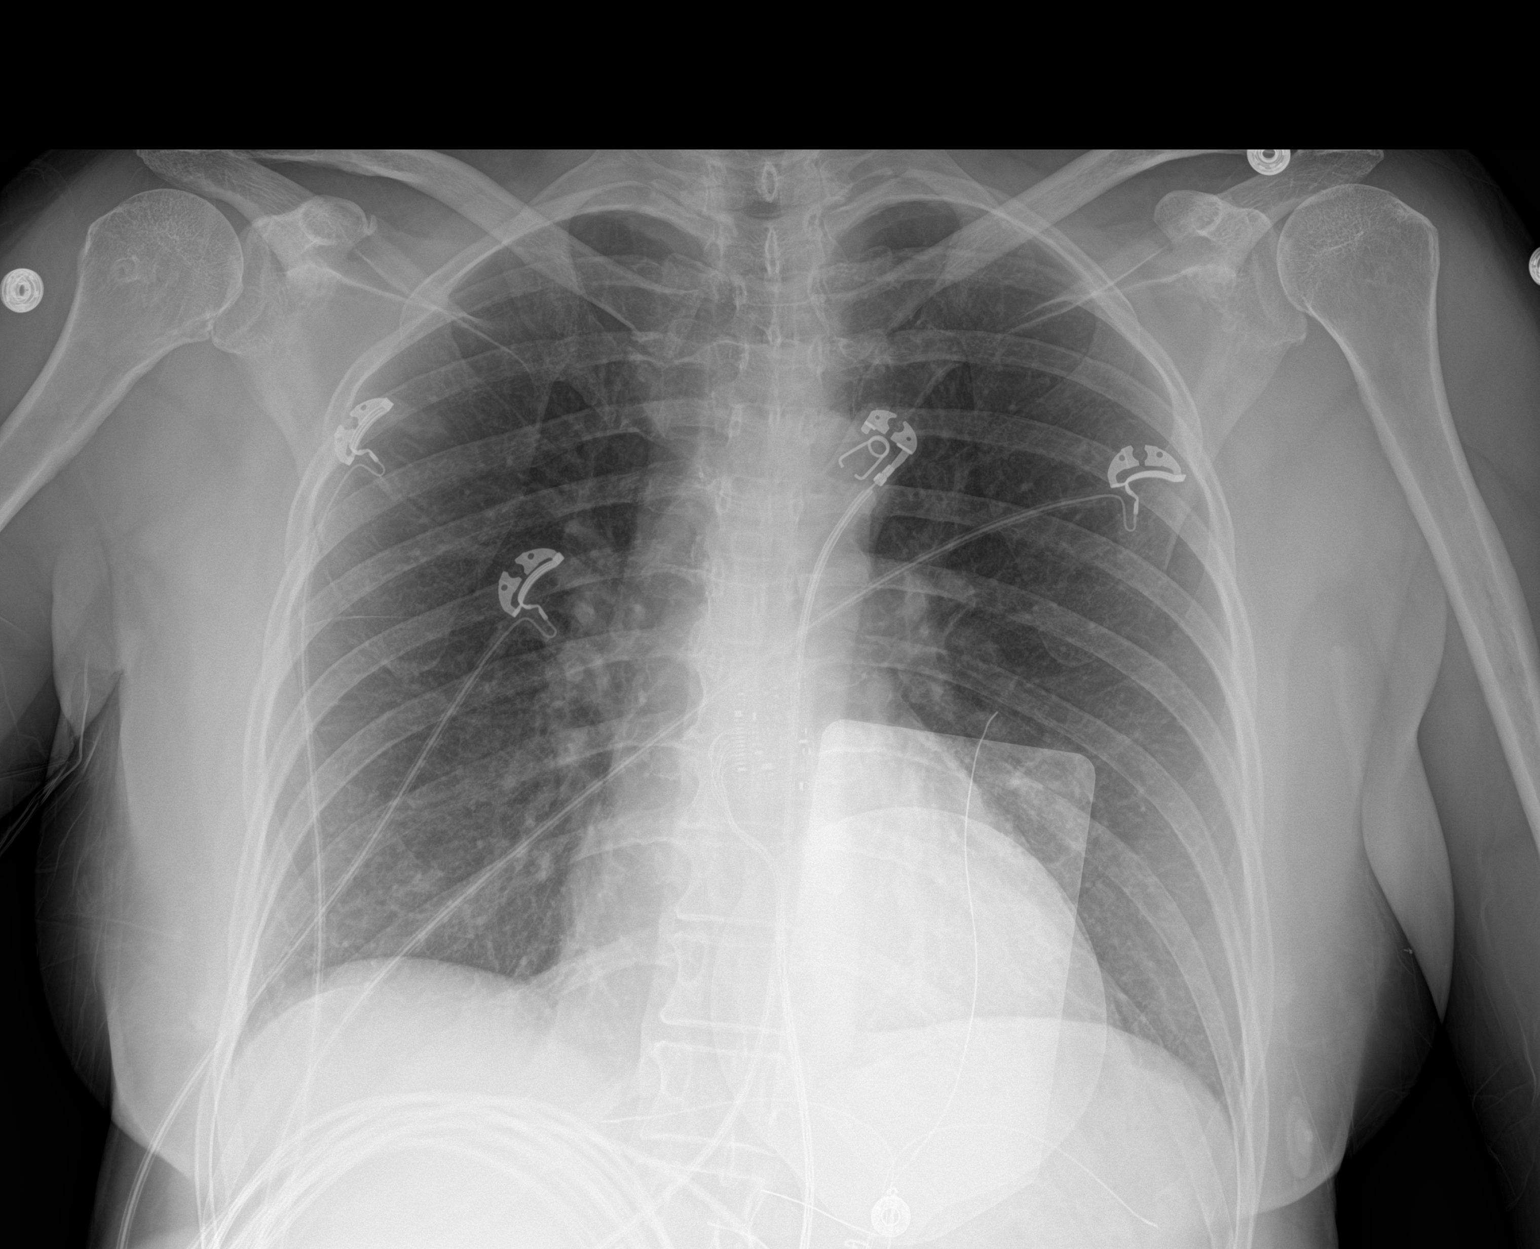

[1 of 1 positions shown; findings below may reference images not displayed]

FINDINGS: Cardiomediastinal silhouette unchanged in size and contour.
Defibrillator pads project over the lower chest.

No evidence of central vascular congestion. No interlobular septal
thickening.

No confluent airspace disease. No pneumothorax. No pleural effusion.
IMPRESSION: No radiographic evidence of acute cardiopulmonary disease.

Defibrillator pads project over the lower chest.

## 2018-01-07 DIAGNOSIS — Z23 Encounter for immunization: Secondary | ICD-10-CM | POA: Diagnosis not present

## 2018-01-07 DIAGNOSIS — Z0000 Encounter for general adult medical examination without abnormal findings: Secondary | ICD-10-CM | POA: Diagnosis not present

## 2018-01-07 DIAGNOSIS — I10 Essential (primary) hypertension: Secondary | ICD-10-CM | POA: Diagnosis not present

## 2018-01-07 DIAGNOSIS — M1990 Unspecified osteoarthritis, unspecified site: Secondary | ICD-10-CM | POA: Diagnosis not present

## 2018-05-07 DIAGNOSIS — R921 Mammographic calcification found on diagnostic imaging of breast: Secondary | ICD-10-CM | POA: Diagnosis not present

## 2018-07-15 DIAGNOSIS — I471 Supraventricular tachycardia: Secondary | ICD-10-CM | POA: Diagnosis not present

## 2018-07-15 DIAGNOSIS — I10 Essential (primary) hypertension: Secondary | ICD-10-CM | POA: Diagnosis not present

## 2018-07-15 DIAGNOSIS — M1990 Unspecified osteoarthritis, unspecified site: Secondary | ICD-10-CM | POA: Diagnosis not present

## 2019-11-03 ENCOUNTER — Other Ambulatory Visit: Payer: Self-pay | Admitting: Orthopedic Surgery

## 2019-11-03 DIAGNOSIS — M25512 Pain in left shoulder: Secondary | ICD-10-CM

## 2019-11-03 DIAGNOSIS — M4722 Other spondylosis with radiculopathy, cervical region: Secondary | ICD-10-CM

## 2019-11-03 DIAGNOSIS — M542 Cervicalgia: Secondary | ICD-10-CM

## 2019-11-03 DIAGNOSIS — M67912 Unspecified disorder of synovium and tendon, left shoulder: Secondary | ICD-10-CM

## 2019-11-28 ENCOUNTER — Ambulatory Visit
Admission: RE | Admit: 2019-11-28 | Discharge: 2019-11-28 | Disposition: A | Discharge status: Home / Self Care | Payer: 59 | Source: Ambulatory Visit | Attending: Orthopedic Surgery | Admitting: Orthopedic Surgery

## 2019-11-28 DIAGNOSIS — M25512 Pain in left shoulder: Secondary | ICD-10-CM

## 2019-11-28 DIAGNOSIS — M542 Cervicalgia: Secondary | ICD-10-CM

## 2019-11-28 DIAGNOSIS — M67912 Unspecified disorder of synovium and tendon, left shoulder: Secondary | ICD-10-CM

## 2019-11-28 DIAGNOSIS — M4722 Other spondylosis with radiculopathy, cervical region: Secondary | ICD-10-CM

## 2020-01-03 HISTORY — PX: ROTATOR CUFF REPAIR: SHX139

## 2020-01-24 ENCOUNTER — Other Ambulatory Visit: Payer: Self-pay | Admitting: Orthopedic Surgery

## 2020-02-25 ENCOUNTER — Encounter (HOSPITAL_COMMUNITY): Payer: Self-pay

## 2020-02-25 NOTE — Progress Notes (Signed)
[id number]en, [place] - [address] [place] [address] Phone: [phone] Fax: [phone]  [facility] [address], [place] - [address] [place] [address] Phone: [phone] Fax: [phone]      Your procedure is scheduled on Wednesday December 15th   .  Report to [facility] Main Entrance "A" at 10 A.M., and check in at the Admitting office.  Call this number if you have problems the morning of surgery:  [phone]  Call [phone] if you have any questions prior to your surgery date Monday-Friday 8am-4pm    Remember:  Do not eat after midnight the night before your surgery  You may drink clear liquids until 10am the morning of your surgery.   Clear liquids allowed are: Water, Non-Citrus Juices (without pulp), Carbonated Beverages, Clear Tea, Black Coffee Only, and Gatorade   Enhanced Recovery after Surgery Enhanced Recovery after Surgery is a protocol used to improve the stress on your body and your recovery after surgery.  Patient Instructions  . The night before surgery:  o No food after midnight. ONLY clear liquids after midnight  .  Marland Kitchen The day of surgery (if you do NOT have diabetes):  o Drink ONE (1) Pre-Surgery Clear Ensure by ___10__ am the morning of surgery   o This drink was given to you during your hospital  pre-op appointment visit. o Nothing else to drink after completing the  Pre-Surgery Clear Ensure.         If you have questions, please contact your surgeon's office.   Take these medicines the morning of surgery with A SIP OF WATER   gabapentin (NEURONTIN) 300 MG capsule  pantoprazole (PROTONIX) 40 MG tablet  IF NEEDED  acetaminophen (TYLENOL) 500 MG tablet   tiZANidine (ZANAFLEX) 2 MG tablet   As of today, STOP taking any Aspirin (unless otherwise instructed by your surgeon) Aleve, Naproxen, Ibuprofen, Motrin, Advil, Goody's, BC's, all herbal medications, fish oil, and  all vitamins.                      Do not wear jewelry, make up, or nail polish            Do not wear lotions, powders, perfumes, or deodorant.            Do not shave 48 hours prior to surgery.             Do not bring valuables to the hospital.            [facility] is not responsible for any belongings or valuables.  Do NOT Smoke (Tobacco/Vaping) or drink Alcohol 24 hours prior to your procedure If you use a CPAP at night, you may bring all equipment for your overnight stay.   Contacts, glasses, dentures or bridgework may not be worn into surgery.      For patients admitted to the hospital, discharge time will be determined by your treatment team.   Patients discharged the day of surgery will not be allowed to drive home, and someone needs to stay with them for 24 hours.    Special instructions:   [place]- Preparing For Surgery  Before surgery, you can play an important role. Because skin is not sterile, your skin needs to be as free of germs as possible. You can reduce the number of germs on your skin by washing with CHG (chlorahexidine gluconate) Soap before surgery.  CHG  is an antiseptic cleaner which kills germs and bonds with the skin to continue killing germs even after washing.    Oral Hygiene is also important to reduce your risk of infection.  Remember - BRUSH YOUR TEETH THE MORNING OF SURGERY WITH YOUR REGULAR TOOTHPASTE  Please do not use if you have an allergy to CHG or antibacterial soaps. If your skin becomes reddened/irritated stop using the CHG.  Do not shave (including legs and underarms) for at least 48 hours prior to first CHG shower. It is OK to shave your face.  Please follow these instructions carefully.   1. Shower the NIGHT BEFORE SURGERY and the MORNING OF SURGERY with CHG Soap.   2. If you chose to wash your hair, wash your hair first as usual with your normal shampoo.  3. After you shampoo, rinse your hair and body thoroughly to remove the  shampoo.  4. Use CHG as you would any other liquid soap. You can apply CHG directly to the skin and wash gently with a scrungie or a clean washcloth.   5. Apply the CHG Soap to your body ONLY FROM THE NECK DOWN.  Do not use on open wounds or open sores. Avoid contact with your eyes, ears, mouth and genitals (private parts). Wash Face and genitals (private parts)  with your normal soap.   6. Wash thoroughly, paying special attention to the area where your surgery will be performed.  7. Thoroughly rinse your body with warm water from the neck down.  8. DO NOT shower/wash with your normal soap after using and rinsing off the CHG Soap.  9. Pat yourself dry with a CLEAN TOWEL.  10. Wear CLEAN PAJAMAS to bed the night before surgery  11. Place CLEAN SHEETS on your bed the night of your first shower and DO NOT SLEEP WITH PETS.   Day of Surgery: Wear Clean/Comfortable clothing the morning of surgery Do not apply any deodorants/lotions.   Remember to brush your teeth WITH YOUR REGULAR TOOTHPASTE.   Please read over the following fact sheets that you were given.

## 2020-02-28 ENCOUNTER — Other Ambulatory Visit: Payer: Self-pay

## 2020-02-28 ENCOUNTER — Encounter (HOSPITAL_COMMUNITY)
Admission: RE | Admit: 2020-02-28 | Discharge: 2020-02-28 | Disposition: A | Discharge status: Home / Self Care | Payer: 59 | Source: Ambulatory Visit | Attending: Orthopedic Surgery | Admitting: Orthopedic Surgery

## 2020-02-28 ENCOUNTER — Other Ambulatory Visit (HOSPITAL_COMMUNITY)
Admission: RE | Admit: 2020-02-28 | Discharge: 2020-02-28 | Disposition: A | Discharge status: Home / Self Care | Payer: 59 | Source: Ambulatory Visit | Attending: Orthopedic Surgery | Admitting: Orthopedic Surgery

## 2020-02-28 ENCOUNTER — Encounter (HOSPITAL_COMMUNITY): Payer: Self-pay

## 2020-02-28 DIAGNOSIS — Z20822 Contact with and (suspected) exposure to covid-19: Secondary | ICD-10-CM | POA: Insufficient documentation

## 2020-02-28 DIAGNOSIS — Z01818 Encounter for other preprocedural examination: Secondary | ICD-10-CM | POA: Insufficient documentation

## 2020-02-28 DIAGNOSIS — Z01812 Encounter for preprocedural laboratory examination: Secondary | ICD-10-CM | POA: Insufficient documentation

## 2020-02-28 HISTORY — DX: Essential (primary) hypertension: I10

## 2020-02-28 HISTORY — DX: Other complications of anesthesia, initial encounter: T88.59XA

## 2020-02-28 HISTORY — DX: Gastro-esophageal reflux disease without esophagitis: K21.9

## 2020-02-28 HISTORY — DX: Unspecified osteoarthritis, unspecified site: M19.90

## 2020-02-28 HISTORY — DX: Cardiac murmur, unspecified: R01.1

## 2020-02-28 LAB — CBC WITH DIFFERENTIAL/PLATELET
Abs Immature Granulocytes: 0.01 10*3/uL (ref 0.00–0.07)
Basophils Absolute: 0.1 10*3/uL (ref 0.0–0.1)
Basophils Relative: 1 %
Eosinophils Absolute: 0.1 10*3/uL (ref 0.0–0.5)
Eosinophils Relative: 2 %
HCT: 43.8 % (ref 36.0–46.0)
Hemoglobin: 15.2 g/dL — ABNORMAL HIGH (ref 12.0–15.0)
Immature Granulocytes: 0 %
Lymphocytes Relative: 48 %
Lymphs Abs: 3.2 10*3/uL (ref 0.7–4.0)
MCH: 31 pg (ref 26.0–34.0)
MCHC: 34.7 g/dL (ref 30.0–36.0)
MCV: 89.4 fL (ref 80.0–100.0)
Monocytes Absolute: 0.4 10*3/uL (ref 0.1–1.0)
Monocytes Relative: 7 %
Neutro Abs: 2.7 10*3/uL (ref 1.7–7.7)
Neutrophils Relative %: 42 %
Platelets: 309 10*3/uL (ref 150–400)
RBC: 4.9 MIL/uL (ref 3.87–5.11)
RDW: 12.4 % (ref 11.5–15.5)
WBC: 6.5 10*3/uL (ref 4.0–10.5)
nRBC: 0 % (ref 0.0–0.2)

## 2020-02-28 LAB — SURGICAL PCR SCREEN
MRSA, PCR: NEGATIVE
Staphylococcus aureus: NEGATIVE

## 2020-02-28 LAB — APTT: aPTT: 27 seconds (ref 24–36)

## 2020-02-28 LAB — TYPE AND SCREEN
ABO/RH(D): O POS
Antibody Screen: NEGATIVE

## 2020-02-28 LAB — URINALYSIS, ROUTINE W REFLEX MICROSCOPIC
Bilirubin Urine: NEGATIVE
Glucose, UA: NEGATIVE mg/dL
Hgb urine dipstick: NEGATIVE
Ketones, ur: NEGATIVE mg/dL
Leukocytes,Ua: NEGATIVE
Nitrite: NEGATIVE
Protein, ur: NEGATIVE mg/dL
Specific Gravity, Urine: 1.003 — ABNORMAL LOW (ref 1.005–1.030)
pH: 7 (ref 5.0–8.0)

## 2020-02-28 LAB — COMPREHENSIVE METABOLIC PANEL
ALT: 25 U/L (ref 0–44)
AST: 24 U/L (ref 15–41)
Albumin: 4.2 g/dL (ref 3.5–5.0)
Alkaline Phosphatase: 81 U/L (ref 38–126)
Anion gap: 12 (ref 5–15)
BUN: 12 mg/dL (ref 6–20)
CO2: 26 mmol/L (ref 22–32)
Calcium: 10 mg/dL (ref 8.9–10.3)
Chloride: 101 mmol/L (ref 98–111)
Creatinine, Ser: 0.78 mg/dL (ref 0.44–1.00)
GFR, Estimated: >60 mL/min (ref >60)
Glucose, Bld: 99 mg/dL (ref 70–99)
Potassium: 3.6 mmol/L (ref 3.5–5.1)
Sodium: 139 mmol/L (ref 135–145)
Total Bilirubin: 0.9 mg/dL (ref 0.3–1.2)
Total Protein: 7.3 g/dL (ref 6.5–8.1)

## 2020-02-28 LAB — PROTIME-INR
INR: 0.9 (ref 0.8–1.2)
Prothrombin Time: 11.9 seconds (ref 11.4–15.2)

## 2020-02-28 NOTE — Progress Notes (Signed)
PCP - Maurice Small Cardiologist - Nishan--follow up prn  Chest x-ray -  na EKG - 02/28/20 Stress Test - 01/2017 ECHO - 01/2017 Cardiac Cath - na  Sleep Study - na  Blood Thinner Instructions: na Aspirin Instructions:  na  ERAS Protcol - yes PRE-SURGERY Ensure given  COVID TEST- 02/28/20   Anesthesia review: cardiac hx. Last episode of palpitations/tightness/lightheadedness was 6-8 months ago with anxiety. Resolved within 10-30 mins. Pt. Rested. Pt. Had rotator cuff repair 01/03/20 on left, and wants staff to be aware .  Patient denies shortness of breath, fever, cough and chest pain at PAT appointment   All instructions explained to the patient, with a verbal understanding of the material. Patient agrees to go over the instructions while at home for a better understanding. Patient also instructed to self quarantine after being tested for COVID-19. The opportunity to ask questions was provided.

## 2020-02-29 LAB — SARS CORONAVIRUS 2 (TAT 6-24 HRS): SARS Coronavirus 2: NEGATIVE

## 2020-02-29 NOTE — Anesthesia Preprocedure Evaluation (Addendum)
Anesthesia Evaluation  Patient identified by MRN, date of birth, ID band Patient awake    Reviewed: Allergy & Precautions, NPO status , Patient's Chart, lab work & pertinent test results  History of Anesthesia Complications Negative for: history of anesthetic complications  Airway Mallampati: II  TM Distance: >3 FB Neck ROM: Full    Dental  (+) Teeth Intact   Pulmonary neg pulmonary ROS,    Pulmonary exam normal        Cardiovascular hypertension, Pt. on medications Normal cardiovascular exam+ dysrhythmias Supra Ventricular Tachycardia      Neuro/Psych negative neurological ROS  negative psych ROS   GI/Hepatic Neg liver ROS, GERD  Medicated,  Endo/Other  negative endocrine ROS  Renal/GU negative Renal ROS  negative genitourinary   Musculoskeletal  (+) Arthritis ,   Abdominal   Peds  Hematology negative hematology ROS (+)   Anesthesia Other Findings  Echo 2018: EF 60-65%, otherwise normal  Exercise tolerance test 02/11/17:   Blood pressure demonstrated a normal response to exercise.  No T wave inversion was noted during stress.  There was no ST segment deviation noted during stress.  Overall, the patient's exercise capacity was normal  Duke Treadmill Score: low risk Negative stress test without evidence of ischemia at given workload. No arrhythmias noted with exercise.  Reproductive/Obstetrics                         Anesthesia Physical Anesthesia Plan  ASA: II  Anesthesia Plan: General   Post-op Pain Management:    Induction: Intravenous  PONV Risk Score and Plan: 3 and Ondansetron, Dexamethasone, Treatment may vary due to age or medical condition and Midazolam  Airway Management Planned: Oral ETT and Video Laryngoscope Planned  Additional Equipment: None  Intra-op Plan:   Post-operative Plan: Extubation in OR  Informed Consent: I have reviewed the patients History  and Physical, chart, labs and discussed the procedure including the risks, benefits and alternatives for the proposed anesthesia with the patient or authorized representative who has indicated his/her understanding and acceptance.     Dental advisory given  Plan Discussed with:   Anesthesia Plan Comments: (PAT note by Antionette Poles, PA-C: History of SVT previously converted with adenosine in the ED. Was evaluated by Dr. Eden Emms 01/23/2017. She had normal echo and exercise test. She was advised to continue PRN inderal and followup as needed.   Pt is s/p recent left rotator cuff repair 01/03/20.  Preop labs reviewed, unremarkable.   EKG 02/28/20: Normal sinus rhythm. Rate 65. Nonspecific ST abnormality  TTE 02/11/17: - Left ventricle: The cavity size was normal. Wall thickness was  normal. Systolic function was normal. The estimated ejection  fraction was in the range of 60% to 65%. Wall motion was normal;  there were no regional wall motion abnormalities. Left  ventricular diastolic function parameters were normal.  - Aortic valve: There was no stenosis.  - Mitral valve: There was trivial regurgitation.  - Right ventricle: The cavity size was normal. Systolic function  was normal.  - Tricuspid valve: Peak RV-RA gradient (S): 16 mm Hg.  - Pulmonary arteries: PA peak pressure: 19 mm Hg (S).  - Inferior vena cava: The vessel was normal in size. The  respirophasic diameter changes were in the normal range (>= 50%),  consistent with normal central venous pressure.   Impressions:   - Normal study.   Exercise tolerance test 02/11/17:  Blood pressure demonstrated a normal response to exercise. No  T wave inversion was noted during stress. There was no ST segment deviation noted during stress. Overall, the patient's exercise capacity was normal Duke Treadmill Score: low risk   Negative stress test without evidence of ischemia at given workload. No arrhythmias noted with  exercise. )       Anesthesia Quick Evaluation

## 2020-02-29 NOTE — Progress Notes (Signed)
Anesthesia Chart Review:  History of SVT previously converted with adenosine in the ED. Was evaluated by Dr. Eden Emms 01/23/2017. She had normal echo and exercise test. She was advised to continue PRN inderal and followup as needed.   Pt is s/p recent left rotator cuff repair 01/03/20.  Preop labs reviewed, unremarkable.   EKG 02/28/20: Normal sinus rhythm. Rate 65. Nonspecific ST abnormality  TTE 02/11/17: - Left ventricle: The cavity size was normal. Wall thickness was  normal. Systolic function was normal. The estimated ejection  fraction was in the range of 60% to 65%. Wall motion was normal;  there were no regional wall motion abnormalities. Left  ventricular diastolic function parameters were normal.  - Aortic valve: There was no stenosis.  - Mitral valve: There was trivial regurgitation.  - Right ventricle: The cavity size was normal. Systolic function  was normal.  - Tricuspid valve: Peak RV-RA gradient (S): 16 mm Hg.  - Pulmonary arteries: PA peak pressure: 19 mm Hg (S).  - Inferior vena cava: The vessel was normal in size. The  respirophasic diameter changes were in the normal range (>= 50%),  consistent with normal central venous pressure.   Impressions:   - Normal study.   Exercise tolerance test 02/11/17:   Blood pressure demonstrated a normal response to exercise.  No T wave inversion was noted during stress.  There was no ST segment deviation noted during stress.  Overall, the patient's exercise capacity was normal  [facility] Treadmill Score: low risk   Negative stress test without evidence of ischemia at given workload. No arrhythmias noted with exercise.   [name] [facility] Short Stay Center/Anesthesiology Phone [phone] 02/29/2020 9:40 AM

## 2020-03-01 ENCOUNTER — Ambulatory Visit (HOSPITAL_COMMUNITY)
Admission: RE | Admit: 2020-03-01 | Discharge: 2020-03-02 | Disposition: A | Discharge status: Home / Self Care | Payer: 59 | Attending: Orthopedic Surgery | Admitting: Orthopedic Surgery

## 2020-03-01 ENCOUNTER — Ambulatory Visit (HOSPITAL_COMMUNITY): Payer: 59 | Admitting: Vascular Surgery

## 2020-03-01 ENCOUNTER — Encounter (HOSPITAL_COMMUNITY): Payer: Self-pay | Admitting: Orthopedic Surgery

## 2020-03-01 ENCOUNTER — Ambulatory Visit (HOSPITAL_COMMUNITY): Payer: 59 | Admitting: Certified Registered"

## 2020-03-01 ENCOUNTER — Ambulatory Visit (HOSPITAL_COMMUNITY)
Admission: RE | Disposition: A | Discharge status: Home / Self Care | Payer: Self-pay | Source: Home / Self Care | Attending: Orthopedic Surgery

## 2020-03-01 ENCOUNTER — Other Ambulatory Visit: Payer: Self-pay

## 2020-03-01 ENCOUNTER — Ambulatory Visit (HOSPITAL_COMMUNITY): Payer: 59

## 2020-03-01 DIAGNOSIS — Z79899 Other long term (current) drug therapy: Secondary | ICD-10-CM | POA: Insufficient documentation

## 2020-03-01 DIAGNOSIS — M4802 Spinal stenosis, cervical region: Secondary | ICD-10-CM | POA: Diagnosis not present

## 2020-03-01 DIAGNOSIS — M5410 Radiculopathy, site unspecified: Secondary | ICD-10-CM | POA: Diagnosis present

## 2020-03-01 DIAGNOSIS — M5412 Radiculopathy, cervical region: Secondary | ICD-10-CM | POA: Insufficient documentation

## 2020-03-01 DIAGNOSIS — Z419 Encounter for procedure for purposes other than remedying health state, unspecified: Secondary | ICD-10-CM

## 2020-03-01 HISTORY — PX: ANTERIOR CERVICAL DECOMPRESSION/DISCECTOMY FUSION 4 LEVELS: SHX5556

## 2020-03-01 LAB — ABO/RH: ABO/RH(D): O POS

## 2020-03-01 SURGERY — ANTERIOR CERVICAL DECOMPRESSION/DISCECTOMY FUSION 4 LEVELS
Anesthesia: General | Site: Spine Cervical

## 2020-03-01 MED ORDER — OXYCODONE HCL 5 MG PO TABS: 5.0000 mg | ORAL_TABLET | Freq: Once | ORAL | Status: AC | PRN

## 2020-03-01 MED ORDER — SUFENTANIL CITRATE 50 MCG/ML IV SOLN
INTRAVENOUS | Status: DC | PRN
  Administered 2020-03-01 (×3): 10 ug via INTRAVENOUS

## 2020-03-01 MED ORDER — BUPIVACAINE-EPINEPHRINE (PF) 0.25% -1:200000 IJ SOLN
INTRAMUSCULAR | Status: AC
  Filled 2020-03-01: qty 30

## 2020-03-01 MED ORDER — LIDOCAINE HCL (CARDIAC) PF 100 MG/5ML IV SOSY
PREFILLED_SYRINGE | INTRAVENOUS | Status: DC | PRN
  Administered 2020-03-01: 100 mg via INTRAVENOUS

## 2020-03-01 MED ORDER — METHOCARBAMOL 500 MG PO TABS
500.0000 mg | ORAL_TABLET | Freq: Four times a day (QID) | ORAL | Status: DC | PRN
  Administered 2020-03-02 (×2): 500 mg via ORAL
  Filled 2020-03-01 (×2): qty 1

## 2020-03-01 MED ORDER — LACTATED RINGERS IV SOLN: INTRAVENOUS | Status: DC

## 2020-03-01 MED ORDER — PROPOFOL 10 MG/ML IV BOLUS
INTRAVENOUS | Status: DC | PRN
  Administered 2020-03-01: 150 mg via INTRAVENOUS

## 2020-03-01 MED ORDER — ONDANSETRON HCL 4 MG/2ML IJ SOLN
INTRAMUSCULAR | Status: AC
  Filled 2020-03-01: qty 2

## 2020-03-01 MED ORDER — CHLORHEXIDINE GLUCONATE 0.12 % MT SOLN
OROMUCOSAL | Status: AC
  Administered 2020-03-01: 11:00:00 15 mL via OROMUCOSAL
  Filled 2020-03-01: qty 15

## 2020-03-01 MED ORDER — KETOROLAC TROMETHAMINE 30 MG/ML IJ SOLN
INTRAMUSCULAR | Status: AC
  Filled 2020-03-01: qty 1

## 2020-03-01 MED ORDER — THROMBIN (RECOMBINANT) 20000 UNITS EX SOLR
CUTANEOUS | Status: AC
  Filled 2020-03-01: qty 20000

## 2020-03-01 MED ORDER — SODIUM CHLORIDE 0.9 % IV SOLN: 250.0000 mL | INTRAVENOUS | Status: DC

## 2020-03-01 MED ORDER — BISACODYL 5 MG PO TBEC: 5.0000 mg | DELAYED_RELEASE_TABLET | Freq: Every day | ORAL | Status: DC | PRN

## 2020-03-01 MED ORDER — SENNOSIDES-DOCUSATE SODIUM 8.6-50 MG PO TABS
1.0000 | ORAL_TABLET | Freq: Every evening | ORAL | Status: DC | PRN
Start: 2020-03-01 — End: 2020-03-02

## 2020-03-01 MED ORDER — PANTOPRAZOLE SODIUM 40 MG PO TBEC
40.0000 mg | DELAYED_RELEASE_TABLET | Freq: Two times a day (BID) | ORAL | Status: DC
  Administered 2020-03-01 – 2020-03-02 (×2): 40 mg via ORAL
  Filled 2020-03-01 (×2): qty 1

## 2020-03-01 MED ORDER — ROCURONIUM BROMIDE 10 MG/ML (PF) SYRINGE
PREFILLED_SYRINGE | INTRAVENOUS | Status: AC
  Filled 2020-03-01: qty 20

## 2020-03-01 MED ORDER — SODIUM CHLORIDE 0.9% FLUSH: 3.0000 mL | INTRAVENOUS | Status: DC | PRN

## 2020-03-01 MED ORDER — MIDAZOLAM HCL 2 MG/2ML IJ SOLN
INTRAMUSCULAR | Status: DC | PRN
  Administered 2020-03-01: 2 mg via INTRAVENOUS

## 2020-03-01 MED ORDER — ORAL CARE MOUTH RINSE: 15.0000 mL | Freq: Once | OROMUCOSAL | Status: AC

## 2020-03-01 MED ORDER — ONDANSETRON HCL 4 MG/2ML IJ SOLN: 4.0000 mg | Freq: Four times a day (QID) | INTRAMUSCULAR | Status: DC | PRN

## 2020-03-01 MED ORDER — ACETAMINOPHEN 325 MG PO TABS: 650.0000 mg | ORAL_TABLET | ORAL | Status: DC | PRN

## 2020-03-01 MED ORDER — BUPIVACAINE-EPINEPHRINE 0.25% -1:200000 IJ SOLN
INTRAMUSCULAR | Status: DC | PRN
  Administered 2020-03-01: 5 mL

## 2020-03-01 MED ORDER — LACTATED RINGERS IV SOLN: INTRAVENOUS | Status: DC | PRN

## 2020-03-01 MED ORDER — DOCUSATE SODIUM 100 MG PO CAPS
100.0000 mg | ORAL_CAPSULE | Freq: Two times a day (BID) | ORAL | Status: DC
  Administered 2020-03-02: 09:00:00 100 mg via ORAL
  Filled 2020-03-01 (×2): qty 1

## 2020-03-01 MED ORDER — PHENYLEPHRINE HCL-NACL 10-0.9 MG/250ML-% IV SOLN
INTRAVENOUS | Status: DC | PRN
  Administered 2020-03-01: 20 ug/min via INTRAVENOUS

## 2020-03-01 MED ORDER — CHLORHEXIDINE GLUCONATE 0.12 % MT SOLN: 15.0000 mL | Freq: Once | OROMUCOSAL | Status: AC

## 2020-03-01 MED ORDER — ONDANSETRON HCL 4 MG/2ML IJ SOLN: 4.0000 mg | Freq: Once | INTRAMUSCULAR | Status: DC | PRN

## 2020-03-01 MED ORDER — ALUM & MAG HYDROXIDE-SIMETH 200-200-20 MG/5ML PO SUSP: 30.0000 mL | Freq: Four times a day (QID) | ORAL | Status: DC | PRN

## 2020-03-01 MED ORDER — CEFAZOLIN SODIUM-DEXTROSE 2-4 GM/100ML-% IV SOLN
2.0000 g | Freq: Three times a day (TID) | INTRAVENOUS | Status: AC
  Administered 2020-03-01 – 2020-03-02 (×2): 2 g via INTRAVENOUS
  Filled 2020-03-01 (×2): qty 100

## 2020-03-01 MED ORDER — MENTHOL 3 MG MT LOZG: 1.0000 | LOZENGE | OROMUCOSAL | Status: DC | PRN

## 2020-03-01 MED ORDER — POVIDONE-IODINE 7.5 % EX SOLN: Freq: Once | CUTANEOUS | Status: DC

## 2020-03-01 MED ORDER — MIDAZOLAM HCL 2 MG/2ML IJ SOLN
INTRAMUSCULAR | Status: AC
  Filled 2020-03-01: qty 2

## 2020-03-01 MED ORDER — FENTANYL CITRATE (PF) 100 MCG/2ML IJ SOLN
25.0000 ug | INTRAMUSCULAR | Status: DC | PRN
Start: 2020-03-01 — End: 2020-03-01
  Administered 2020-03-01: 25 ug via INTRAVENOUS
  Administered 2020-03-01: 50 ug via INTRAVENOUS

## 2020-03-01 MED ORDER — SODIUM CHLORIDE 0.9% FLUSH: 3.0000 mL | Freq: Two times a day (BID) | INTRAVENOUS | Status: DC

## 2020-03-01 MED ORDER — OXYCODONE HCL 5 MG PO TABS
ORAL_TABLET | ORAL | Status: AC
  Administered 2020-03-01: 18:00:00 5 mg via ORAL
  Filled 2020-03-01: qty 1

## 2020-03-01 MED ORDER — OXYCODONE-ACETAMINOPHEN 5-325 MG PO TABS
1.0000 | ORAL_TABLET | ORAL | Status: DC | PRN
  Administered 2020-03-01 – 2020-03-02 (×4): 2 via ORAL
  Filled 2020-03-01 (×4): qty 2

## 2020-03-01 MED ORDER — PROPOFOL 10 MG/ML IV BOLUS
INTRAVENOUS | Status: AC
  Filled 2020-03-01: qty 20

## 2020-03-01 MED ORDER — SUGAMMADEX SODIUM 200 MG/2ML IV SOLN
INTRAVENOUS | Status: DC | PRN
  Administered 2020-03-01: 200 mg via INTRAVENOUS

## 2020-03-01 MED ORDER — PHENOL 1.4 % MT LIQD: 1.0000 | OROMUCOSAL | Status: DC | PRN

## 2020-03-01 MED ORDER — AMISULPRIDE (ANTIEMETIC) 5 MG/2ML IV SOLN: 10.0000 mg | Freq: Once | INTRAVENOUS | Status: DC | PRN

## 2020-03-01 MED ORDER — HYDROXYZINE HCL 50 MG/ML IM SOLN
50.0000 mg | Freq: Four times a day (QID) | INTRAMUSCULAR | Status: DC | PRN
  Administered 2020-03-01: 19:00:00 50 mg via INTRAMUSCULAR
  Filled 2020-03-01: qty 1

## 2020-03-01 MED ORDER — SUFENTANIL CITRATE 50 MCG/ML IV SOLN
INTRAVENOUS | Status: AC
  Filled 2020-03-01: qty 1

## 2020-03-01 MED ORDER — OXYCODONE HCL 5 MG/5ML PO SOLN: 5.0000 mg | Freq: Once | ORAL | Status: AC | PRN

## 2020-03-01 MED ORDER — ACETAMINOPHEN 10 MG/ML IV SOLN
INTRAVENOUS | Status: AC
  Filled 2020-03-01: qty 100

## 2020-03-01 MED ORDER — ROCURONIUM 10MG/ML (10ML) SYRINGE FOR MEDFUSION PUMP - OPTIME
INTRAVENOUS | Status: DC | PRN
  Administered 2020-03-01: 70 mg via INTRAVENOUS
  Administered 2020-03-01: 30 mg via INTRAVENOUS
  Administered 2020-03-01: 10 mg via INTRAVENOUS
  Administered 2020-03-01: 40 mg via INTRAVENOUS

## 2020-03-01 MED ORDER — HYDROCHLOROTHIAZIDE 12.5 MG PO CAPS
12.5000 mg | ORAL_CAPSULE | Freq: Every day | ORAL | Status: DC
  Administered 2020-03-02: 09:00:00 12.5 mg via ORAL
  Filled 2020-03-01: qty 1

## 2020-03-01 MED ORDER — METHOCARBAMOL 500 MG PO TABS
ORAL_TABLET | ORAL | Status: AC
  Administered 2020-03-01: 18:00:00 500 mg via ORAL
  Filled 2020-03-01: qty 1

## 2020-03-01 MED ORDER — METHOCARBAMOL 1000 MG/10ML IJ SOLN
500.0000 mg | Freq: Four times a day (QID) | INTRAVENOUS | Status: DC | PRN
  Filled 2020-03-01 (×2): qty 5

## 2020-03-01 MED ORDER — PANTOPRAZOLE SODIUM 40 MG IV SOLR: 40.0000 mg | Freq: Every day | INTRAVENOUS | Status: DC

## 2020-03-01 MED ORDER — FENTANYL CITRATE (PF) 100 MCG/2ML IJ SOLN
INTRAMUSCULAR | Status: AC
  Administered 2020-03-01: 17:00:00 50 ug via INTRAVENOUS
  Filled 2020-03-01: qty 2

## 2020-03-01 MED ORDER — DEXAMETHASONE SODIUM PHOSPHATE 10 MG/ML IJ SOLN
INTRAMUSCULAR | Status: DC | PRN
  Administered 2020-03-01: 10 mg via INTRAVENOUS

## 2020-03-01 MED ORDER — FENTANYL CITRATE (PF) 100 MCG/2ML IJ SOLN
INTRAMUSCULAR | Status: AC
  Filled 2020-03-01: qty 2

## 2020-03-01 MED ORDER — LIDOCAINE 2% (20 MG/ML) 5 ML SYRINGE
INTRAMUSCULAR | Status: AC
  Filled 2020-03-01: qty 5

## 2020-03-01 MED ORDER — ACETAMINOPHEN 10 MG/ML IV SOLN
INTRAVENOUS | Status: DC | PRN
  Administered 2020-03-01: 1000 mg via INTRAVENOUS

## 2020-03-01 MED ORDER — ONDANSETRON HCL 4 MG PO TABS: 4.0000 mg | ORAL_TABLET | Freq: Four times a day (QID) | ORAL | Status: DC | PRN

## 2020-03-01 MED ORDER — FLEET ENEMA 7-19 GM/118ML RE ENEM: 1.0000 | ENEMA | Freq: Once | RECTAL | Status: DC | PRN

## 2020-03-01 MED ORDER — THROMBIN 20000 UNITS EX SOLR
CUTANEOUS | Status: DC | PRN
  Administered 2020-03-01: 16:00:00 20000 [IU] via TOPICAL

## 2020-03-01 MED ORDER — ZOLPIDEM TARTRATE 5 MG PO TABS: 5.0000 mg | ORAL_TABLET | Freq: Every evening | ORAL | Status: DC | PRN

## 2020-03-01 MED ORDER — CEFAZOLIN SODIUM-DEXTROSE 2-4 GM/100ML-% IV SOLN
2.0000 g | INTRAVENOUS | Status: AC
  Administered 2020-03-01: 14:00:00 2 g via INTRAVENOUS

## 2020-03-01 MED ORDER — GABAPENTIN 300 MG PO CAPS
300.0000 mg | ORAL_CAPSULE | Freq: Two times a day (BID) | ORAL | Status: DC
  Administered 2020-03-01 – 2020-03-02 (×2): 300 mg via ORAL
  Filled 2020-03-01 (×2): qty 1

## 2020-03-01 MED ORDER — ACETAMINOPHEN 650 MG RE SUPP: 650.0000 mg | RECTAL | Status: DC | PRN

## 2020-03-01 SURGICAL SUPPLY — 73 items
BENZOIN TINCTURE PRP APPL 2/3 (GAUZE/BANDAGES/DRESSINGS) ×3 IMPLANT
BIT DRILL NEURO 2X3.1 SFT TUCH (MISCELLANEOUS) ×1 IMPLANT
BIT DRILL SKYLINE 12MM (BIT) ×1 IMPLANT
BLADE CLIPPER SURG (BLADE) IMPLANT
BLADE SURG 15 STRL LF DISP TIS (BLADE) ×1 IMPLANT
BLADE SURG 15 STRL SS (BLADE) ×3
BUR MATCHSTICK NEURO 3.0 LAGG (BURR) IMPLANT
CARTRIDGE OIL MAESTRO DRILL (MISCELLANEOUS) ×1 IMPLANT
CLOSURE WOUND 1/2 X4 (GAUZE/BANDAGES/DRESSINGS) ×1
COVER SURGICAL LIGHT HANDLE (MISCELLANEOUS) ×3 IMPLANT
COVER WAND RF STERILE (DRAPES) ×3 IMPLANT
DECANTER SPIKE VIAL GLASS SM (MISCELLANEOUS) ×3 IMPLANT
DIFFUSER DRILL AIR PNEUMATIC (MISCELLANEOUS) ×3 IMPLANT
DRAIN JACKSON RD 7FR 3/32 (WOUND CARE) IMPLANT
DRAPE C-ARM 42X72 X-RAY (DRAPES) ×3 IMPLANT
DRAPE POUCH INSTRU U-SHP 10X18 (DRAPES) ×3 IMPLANT
DRAPE SURG 17X23 STRL (DRAPES) ×12 IMPLANT
DRILL BIT SKYLINE 12MM (BIT) ×3
DRILL NEURO 2X3.1 SOFT TOUCH (MISCELLANEOUS) ×3
DURAPREP 26ML APPLICATOR (WOUND CARE) ×3 IMPLANT
ELECT COATED BLADE 2.86 ST (ELECTRODE) ×3 IMPLANT
ELECT REM PT RETURN 9FT ADLT (ELECTROSURGICAL) ×3
ELECTRODE REM PT RTRN 9FT ADLT (ELECTROSURGICAL) ×1 IMPLANT
EVACUATOR SILICONE 100CC (DRAIN) IMPLANT
GAUZE 4X4 16PLY RFD (DISPOSABLE) ×3 IMPLANT
GAUZE SPONGE 4X4 12PLY STRL (GAUZE/BANDAGES/DRESSINGS) ×3 IMPLANT
GLOVE BIO SURGEON STRL SZ7 (GLOVE) ×3 IMPLANT
GLOVE BIO SURGEON STRL SZ8 (GLOVE) ×3 IMPLANT
GLOVE BIOGEL PI IND STRL 7.0 (GLOVE) ×2 IMPLANT
GLOVE BIOGEL PI IND STRL 8 (GLOVE) ×1 IMPLANT
GLOVE BIOGEL PI INDICATOR 7.0 (GLOVE) ×4
GLOVE BIOGEL PI INDICATOR 8 (GLOVE) ×2
GOWN STRL REUS W/ TWL LRG LVL3 (GOWN DISPOSABLE) ×2 IMPLANT
GOWN STRL REUS W/ TWL XL LVL3 (GOWN DISPOSABLE) ×1 IMPLANT
GOWN STRL REUS W/TWL LRG LVL3 (GOWN DISPOSABLE) ×6
GOWN STRL REUS W/TWL XL LVL3 (GOWN DISPOSABLE) ×3
INTERLOCK LRDTC CRVCL VBR 6MM (Bone Implant) ×2 IMPLANT
INTERLOCK LRDTC CRVCL VBR SM (Bone Implant) ×1 IMPLANT
IV CATH 14GX2 1/4 (CATHETERS) ×3 IMPLANT
KIT BASIN OR (CUSTOM PROCEDURE TRAY) ×3 IMPLANT
KIT TURNOVER KIT B (KITS) ×3 IMPLANT
LORDOTIC CERVICAL VBR 6MM SM (Bone Implant) ×6 IMPLANT
LORDOTIC CERVICAL VBR SM 5MM (Bone Implant) ×3 IMPLANT
MANIFOLD NEPTUNE II (INSTRUMENTS) ×3 IMPLANT
NEEDLE PRECISIONGLIDE 27X1.5 (NEEDLE) ×3 IMPLANT
NEEDLE SPNL 20GX3.5 QUINCKE YW (NEEDLE) ×3 IMPLANT
NS IRRIG 1000ML POUR BTL (IV SOLUTION) ×3 IMPLANT
OIL CARTRIDGE MAESTRO DRILL (MISCELLANEOUS) ×3
PACK ORTHO CERVICAL (CUSTOM PROCEDURE TRAY) ×3 IMPLANT
PAD ARMBOARD 7.5X6 YLW CONV (MISCELLANEOUS) ×6 IMPLANT
PATTIES SURGICAL .5 X.5 (GAUZE/BANDAGES/DRESSINGS) ×6 IMPLANT
PATTIES SURGICAL .5 X1 (DISPOSABLE) IMPLANT
PIN DISTRACTION 14 (PIN) ×6 IMPLANT
PLATE SKYLINE 3LVL 45MM CERV (Plate) ×3 IMPLANT
POSITIONER HEAD DONUT 9IN (MISCELLANEOUS) ×3 IMPLANT
PUTTY BONE DBX 5CC MIX (Putty) ×3 IMPLANT
SCREW SKYLINE VARIABLE LG (Screw) ×24 IMPLANT
SPONGE INTESTINAL PEANUT (DISPOSABLE) ×9 IMPLANT
SPONGE SURGIFOAM ABS GEL 100 (HEMOSTASIS) ×3 IMPLANT
STRIP CLOSURE SKIN 1/2X4 (GAUZE/BANDAGES/DRESSINGS) ×2 IMPLANT
SURGIFLO W/THROMBIN 8M KIT (HEMOSTASIS) IMPLANT
SUT MNCRL AB 4-0 PS2 18 (SUTURE) ×3 IMPLANT
SUT VIC AB 2-0 CP2 18 (SUTURE) ×6 IMPLANT
SUT VIC AB 2-0 CT2 18 VCP726D (SUTURE) ×6 IMPLANT
SYR BULB IRRIG 60ML STRL (SYRINGE) ×3 IMPLANT
SYR CONTROL 10ML LL (SYRINGE) ×6 IMPLANT
TAPE CLOTH 4X10 WHT NS (GAUZE/BANDAGES/DRESSINGS) ×3 IMPLANT
TAPE UMBILICAL COTTON 1/8X30 (MISCELLANEOUS) ×3 IMPLANT
TOWEL GREEN STERILE (TOWEL DISPOSABLE) ×3 IMPLANT
TOWEL GREEN STERILE FF (TOWEL DISPOSABLE) ×3 IMPLANT
TRAY FOLEY W/BAG SLVR 14FR (SET/KITS/TRAYS/PACK) ×3 IMPLANT
WATER STERILE IRR 1000ML POUR (IV SOLUTION) ×3 IMPLANT
YANKAUER SUCT BULB TIP NO VENT (SUCTIONS) ×3 IMPLANT

## 2020-03-01 NOTE — H&P (Signed)
PREOPERATIVE H&P  Chief Complaint: Left arm pain  HPI: [name] is a 59 y.o. female who presents with ongoing pain in the left arm  MRI reveals disc degeneration and spinal stenosis spanning C4-C7  Patient has failed multiple forms of conservative care and continues to have pain (see office notes for additional details regarding the patient's full course of treatment)  Past Medical History:  Diagnosis Date  . Anxiety   . Arthritis   . Chest pain   . Complication of anesthesia    slow to wake up  . Depression 11/15/2010   denies  . Dizziness   . GERD (gastroesophageal reflux disease)   . Heart murmur   . History of diplopia   . Hypertension   . MVP (mitral valve prolapse)   . Palpitations 2018   Dr. Eden Emms  . SVT (supraventricular tachycardia) (HCC) 2018   Dr. Eden Emms   Past Surgical History:  Procedure Laterality Date  . CESAREAN SECTION    . HAND SURGERY Left   . KNEE ARTHROSCOPY Left   . ROTATOR CUFF REPAIR Left 01/03/2020  . TRANSTHORACIC ECHOCARDIOGRAM  10/23/2010   EF 55-60%  . TUBAL LIGATION     Social History   Socioeconomic History  . Marital status: Married    Spouse name: Not on file  . Number of children: Not on file  . Years of education: Not on file  . Highest education level: Not on file  Occupational History  . Not on file  Tobacco Use  . Smoking status: Never Smoker  . Smokeless tobacco: Never Used  Vaping Use  . Vaping Use: Never used  Substance and Sexual Activity  . Alcohol use: Yes    Alcohol/week: 1.0 standard drink    Types: 1 Glasses of wine per week  . Drug use: No  . Sexual activity: Not on file  Other Topics Concern  . Not on file  Social History Narrative  . Not on file   Social Determinants of Health   Financial Resource Strain: Not on file  Food Insecurity: Not on file  Transportation Needs: Not on file  Physical Activity: Not on file  Stress: Not on file  Social Connections: Not on file   No family  history on file. No Known Allergies Prior to Admission medications   Medication Sig Start Date End Date Taking? Authorizing Provider  acetaminophen (TYLENOL) 500 MG tablet Take 500 mg by mouth every 6 (six) hours as needed for moderate pain.   Yes [provider]  gabapentin (NEURONTIN) 300 MG capsule Take 300 mg by mouth See admin instructions. Take 300 mg twice daily, may take a third 300 mg dose as needed for pain 01/26/20  Yes [provider]  hydrochlorothiazide (HYDRODIURIL) 12.5 MG tablet Take 12.5 mg by mouth daily. 12/09/16  Yes [provider]  pantoprazole (PROTONIX) 40 MG tablet Take 40 mg by mouth 2 (two) times daily. 02/03/20  Yes [provider]  tiZANidine (ZANAFLEX) 2 MG tablet Take 2 mg by mouth daily as needed for muscle spasms. 02/02/20  Yes [provider]     All other systems have been reviewed and were otherwise negative with the exception of those mentioned in the HPI and as above.  Physical Exam: Vitals:   03/01/20 0953  BP: (!) 161/78  Pulse: 70  Resp: 17  Temp: 97.7 F (36.5 C)  SpO2: 99%    Body mass index is 28.35 kg/m.  General: Alert,  no acute distress Cardiovascular: No pedal edema Respiratory: No cyanosis, no use of accessory musculature Skin: No lesions in the area of chief complaint Neurologic: Sensation intact distally Psychiatric: Patient is competent for consent with normal mood and affect Lymphatic: No axillary or cervical lymphadenopathy   Assessment/Plan: LEFT-SIDED CERVICAL RADICULOPATHY WITH PROMINENT DEGENERATIVE CHANGES AND NEUROLOGIC COMPRESSION SPANNING CERVICAL 4 - CERVICAL 7 Plan for Procedure(s): ANTERIOR CERVICAL DECOMPRESSION FUSION CERVICAL 4-5, CERVICAL 5-6, CERVICAL 6-7 WITH INSTRUMENTATION AND ALLOGRAFT   Jackelyn Hoehn, MD 03/01/2020 11:57 AM

## 2020-03-01 NOTE — Anesthesia Procedure Notes (Signed)
Procedure Name: Intubation Date/Time: 03/01/2020 1:26 PM Performed by: Claris Che, CRNA Pre-anesthesia Checklist: Patient identified, Emergency Drugs available, Suction available, Patient being monitored and Timeout performed Patient Re-evaluated:Patient Re-evaluated prior to induction Oxygen Delivery Method: Circle system utilized Preoxygenation: Pre-oxygenation with 100% oxygen Induction Type: IV induction and Cricoid Pressure applied Ventilation: Mask ventilation without difficulty Laryngoscope Size: Mac and 3 Grade View: Grade II Tube type: Oral Number of attempts: 1 Airway Equipment and Method: Stylet Placement Confirmation: ETT inserted through vocal cords under direct vision,  positive ETCO2 and breath sounds checked- equal and bilateral Secured at: 23 cm Tube secured with: Tape Dental Injury: Teeth and Oropharynx as per pre-operative assessment

## 2020-03-01 NOTE — Transfer of Care (Signed)
Immediate Anesthesia Transfer of Care Note  Patient: ELVIN BANKER  Procedure(s) Performed: ANTERIOR CERVICAL DECOMPRESSION FUSION CERVICAL 4-5, CERVICAL 5-6, CERVICAL 6-7 WITH INSTRUMENTATION AND ALLOGRAFT (N/A Spine Cervical)  Patient Location: PACU  Anesthesia Type:General  Level of Consciousness: awake, drowsy and patient cooperative  Airway & Oxygen Therapy: Patient Spontanous Breathing  Post-op Assessment: Report given to RN and Post -op Vital signs reviewed and stable  Post vital signs: Reviewed and stable  Last Vitals:  Vitals Value Taken Time  BP 144/83 03/01/20 1622  Temp    Pulse 71 03/01/20 1625  Resp 13 03/01/20 1625  SpO2 94 % 03/01/20 1625  Vitals shown include unvalidated device data.  Last Pain:  Vitals:   03/01/20 0953  TempSrc: Oral         Complications: No complications documented.

## 2020-03-01 NOTE — Op Note (Signed)
PATIENT NAME: [name]   MEDICAL RECORD NO.:   [id number]    DATE OF BIRTH: 1961-03-07   DATE OF PROCEDURE: 03/01/2020                               OPERATIVE REPORT     PREOPERATIVE DIAGNOSES: 1. Left-sided cervical radiculopathy. 2. Spinal stenosis spanning C4-C7.   POSTOPERATIVE DIAGNOSES: 1. Left-sided cervical radiculopathy. 2. Spinal stenosis spanning C4-C7.   PROCEDURE: 1. Anterior cervical decompression and fusion C4/5, C5/6, C6/7. 2. Placement of anterior instrumentation, C4-C7. 3. Insertion of interbody device x 3 (Titan intervertebral spacers). 4. Intraoperative use of fluoroscopy. 5. Use of morselized allograft - DBX-mix   SURGEON:  Estill Bamberg, MD   ASSISTANT:  Jason Coop, PA-C.   ANESTHESIA:  General endotracheal anesthesia.   COMPLICATIONS:  None.   DISPOSITION:  Stable.   ESTIMATED BLOOD LOSS:  Minimal.   INDICATIONS FOR SURGERY:  Briefly, [name] is a pleasant 59 y.o. year- old female, who did present to me with severe pain in the neck and left arm.  The patient's MRI did reveal the findings noted above.  Given the patient's ongoing rather debilitating pain and lack of improvement with appropriate treatment measures, we did discuss proceeding with the procedure noted above.  The patient was fully aware of the risks and limitations of surgery as outlined in my preoperative note.   OPERATIVE DETAILS:  On 03/01/2020, the patient was brought to surgery and general endotracheal anesthesia was administered.  The patient was placed supine on the hospital bed. The neck was gently extended.  All bony prominences were meticulously padded.  The neck was prepped and draped in the usual sterile fashion.  At this point, I did make a left-sided transverse incision.  The platysma was incised.  A Smith-Robinson approach was used and the anterior spine was identified. A self-retaining retractor was placed.  I then subperiosteally exposed the vertebral  bodies from C4-C7.  Caspar pins were then placed into the C6 and C7 vertebral bodies and distraction was applied.  A thorough and complete C6-7 intervertebral diskectomy was performed. The posterior longitudinal ligament was identified and entered using a nerve hook.  I then used #1 followed by #2 Kerrison to perform a thorough and complete intervertebral diskectomy. The spinal canal was thoroughly decompressed, as was the right and left neuroforamen. The endplates were then prepared and the appropriate-sized intervertebral spacer (7mm) was then packed with DBX-mix and tamped into position in the usual fashion. The lower Caspar pin was then removed and placed into the C5 vertebral body and once again, distraction was applied across the C5-6 intervertebral space.  I then again performed a thorough and complete diskectomy, thoroughly decompressing the spinal canal and bilateral neuroforamena. After preparing the endplates, the appropriate-sized intervertebral spacer (13mm)was packed with DBX-mix and tamped into position. The lower Caspar pin was then removed and placed into the C4 vertebral body and once again, distraction was applied across the C4-5 intervertebral space.  I then again performed a thorough and complete diskectomy, thoroughly decompressing the spinal canal and bilateral neuroforamena.  After preparing the endplates, the appropriate-sized intervertebral spacer (6 mm) was packed with DBX-mix and tamped into position.  The Caspar pins then were removed and bone wax was placed in their place.  The appropriate-sized anterior cervical plate was placed over the anterior spine.  12 mm variable angle screws were placed, 2 in each vertebral body  from C4-C7 for a total of 8 vertebral body screws.  The screws were then locked to the plate using the Cam locking mechanism.  I was very pleased with the final fluoroscopic images.  The wound was then irrigated.  The wound was then explored for  any undue bleeding and there was no bleeding noted. The wound was then closed in layers using 2-0 Vicryl, followed by 4-0 Monocryl.  Benzoin and Steri-Strips were applied, followed by sterile dressing.  All instrument counts were correct at the termination of the procedure.   Of note, Jason Coop, PA-C, was my assistant throughout surgery, and did aid in retraction, suctioning, placement of the hardware, and closure from start to finish.       Estill Bamberg, MD

## 2020-03-02 ENCOUNTER — Encounter (HOSPITAL_COMMUNITY): Payer: Self-pay | Admitting: Orthopedic Surgery

## 2020-03-02 DIAGNOSIS — M4802 Spinal stenosis, cervical region: Secondary | ICD-10-CM | POA: Diagnosis not present

## 2020-03-02 MED ORDER — MAGNESIUM CITRATE PO SOLN
1.0000 | Freq: Once | ORAL | Status: AC
  Administered 2020-03-02: 11:00:00 1 via ORAL
  Filled 2020-03-02: qty 296

## 2020-03-02 MED ORDER — OXYCODONE-ACETAMINOPHEN 5-325 MG PO TABS: 1.0000 | ORAL_TABLET | ORAL | 0 refills | Status: AC | PRN

## 2020-03-02 MED ORDER — METHOCARBAMOL 500 MG PO TABS: 500.0000 mg | ORAL_TABLET | Freq: Four times a day (QID) | ORAL | 1 refills | Status: AC | PRN

## 2020-03-02 MED FILL — Thrombin (Recombinant) For Soln 20000 Unit: CUTANEOUS | Qty: 1 | Status: AC

## 2020-03-02 NOTE — Plan of Care (Signed)
Patient alert and oriented, mae's well, voiding adequate amount of urine, swallowing without difficulty, no c/o pain at time of discharge. Patient discharged home with family. Script and discharged instructions given to patient. Patient and family stated understanding of instructions given. Patient has an appointment with Dr. Dumonski 

## 2020-03-02 NOTE — Progress Notes (Signed)
    Patient doing well  Denies arm pain   Physical Exam: Vitals:   03/01/20 2330 03/02/20 0349  BP: 117/67 106/61  Pulse: (!) 106 96  Resp: 18 18  Temp: 98.3 F (36.8 C) 98.2 F (36.8 C)  SpO2: 95% 94%    Dressing in place NVI  POD #1 s/p C4-7 ACDF, doing well  - up with PT/OT, encourage ambulation - Percocet for pain, Robaxin for muscle spasms - likely d/c home today with f/u in 2 weeks

## 2020-03-02 NOTE — Anesthesia Postprocedure Evaluation (Signed)
Anesthesia Post Note  Patient: [name]  Procedure(s) Performed: ANTERIOR CERVICAL DECOMPRESSION FUSION CERVICAL 4-5, CERVICAL 5-6, CERVICAL 6-7 WITH INSTRUMENTATION AND ALLOGRAFT (N/A Spine Cervical)     Patient location during evaluation: PACU Anesthesia Type: General Level of consciousness: awake and alert Pain management: pain level controlled Vital Signs Assessment: post-procedure vital signs reviewed and stable Respiratory status: spontaneous breathing, nonlabored ventilation and respiratory function stable Cardiovascular status: blood pressure returned to baseline and stable Postop Assessment: no apparent nausea or vomiting Anesthetic complications: no   No complications documented.  Last Vitals:  Vitals:   03/02/20 0349 03/02/20 0737  BP: 106/61 127/64  Pulse: 96 (!) 103  Resp: 18 16  Temp: 36.8 C 36.5 C  SpO2: 94% 97%    Last Pain:  Vitals:   03/02/20 0737  TempSrc: Oral  PainSc:                  Lucretia Kern

## 2020-10-20 IMAGING — MR MR CERVICAL SPINE W/O CM
4 of 5 series · 28 of 48 positions shown · non-contrast
Comparison: Cervical spine MRI 08/10/2011

CLINICAL DATA: Cervical spondylosis with radiculopathy. Neck pain.
Additional history provided by scanning technologist: Patient
reports neck pain and left shoulder pain worsening over the past
several months, weakness in left arm.

EXAM:
MRI CERVICAL SPINE WITHOUT CONTRAST
TECHNIQUE: Multiplanar, multisequence MR imaging of the cervical spine was
performed. No intravenous contrast was administered.

[Series 5: T2 · sagittal · 3.0mm · 0.55mm/px · 8 of 15 slices shown (1 of 2)]
[im 1/15]
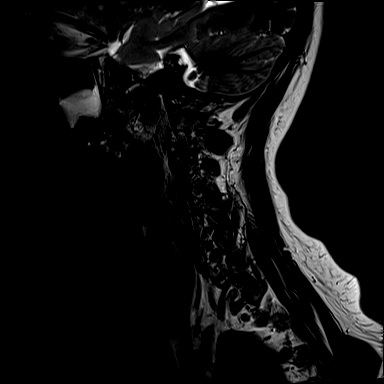
[im 3/15]
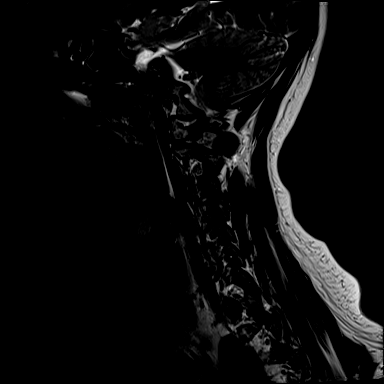
[im 5/15]
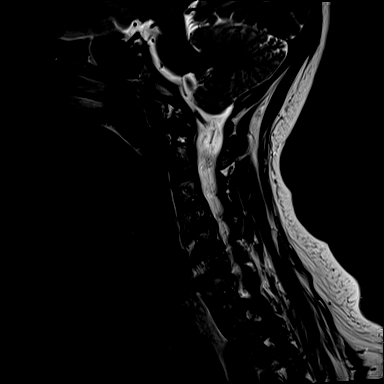
[im 7/15]
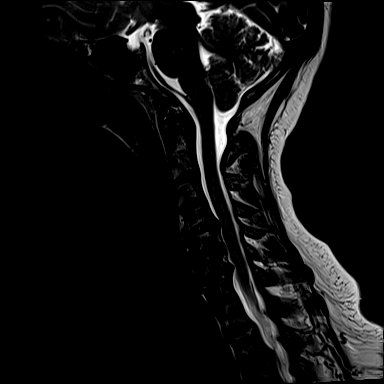
[im 9/15]
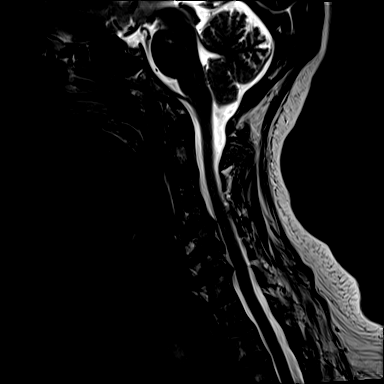
[im 11/15]
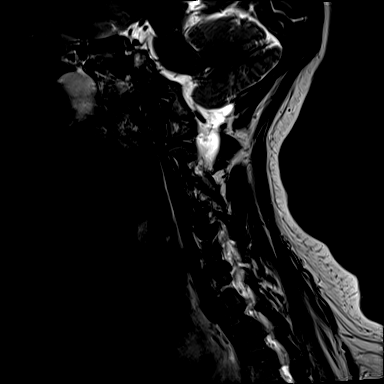
[im 13/15]
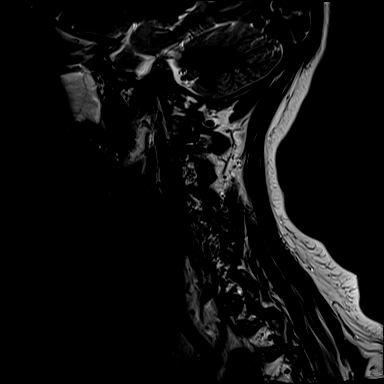
[im 15/15]
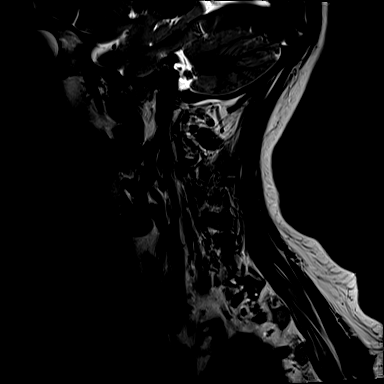

[Series 6: T1 · sagittal · 3.0mm · 0.82mm/px · 7 of 15 slices shown]
[im 1/15]
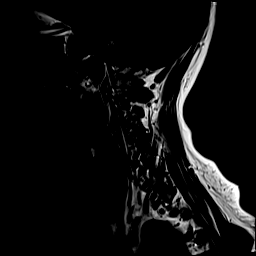
[im 3/15]
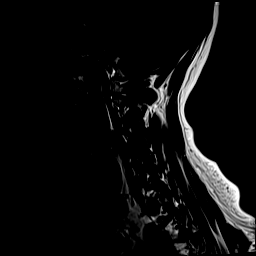
[im 5/15]
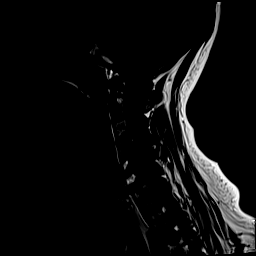
[im 8/15]
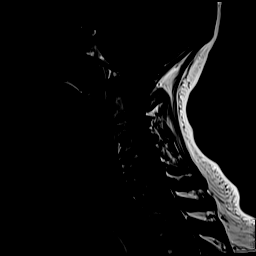
[im 10/15]
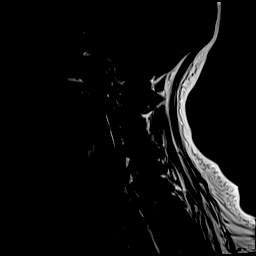
[im 12/15]
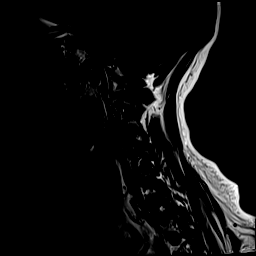
[im 15/15]
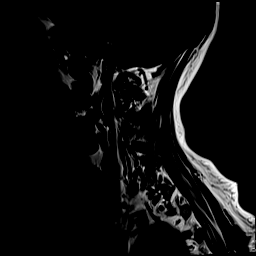

[Series 7: STIR · sagittal · 3.0mm · 0.33mm/px · 4 of 15 slices shown]
[im 1/15]
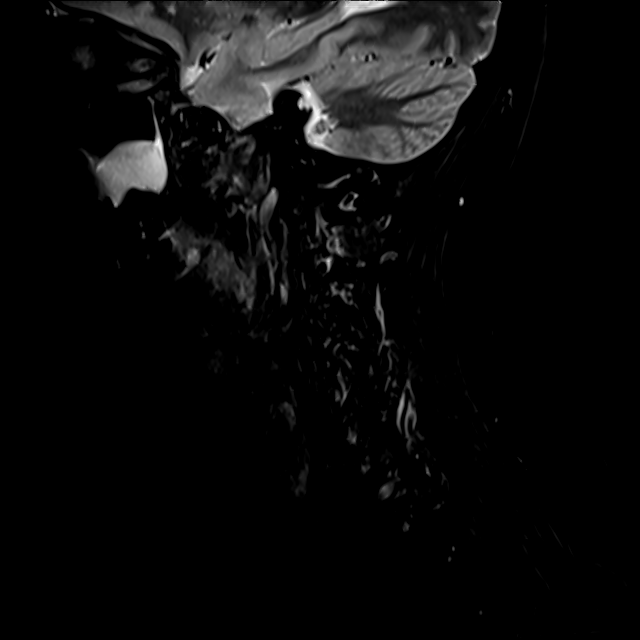
[im 3/15]
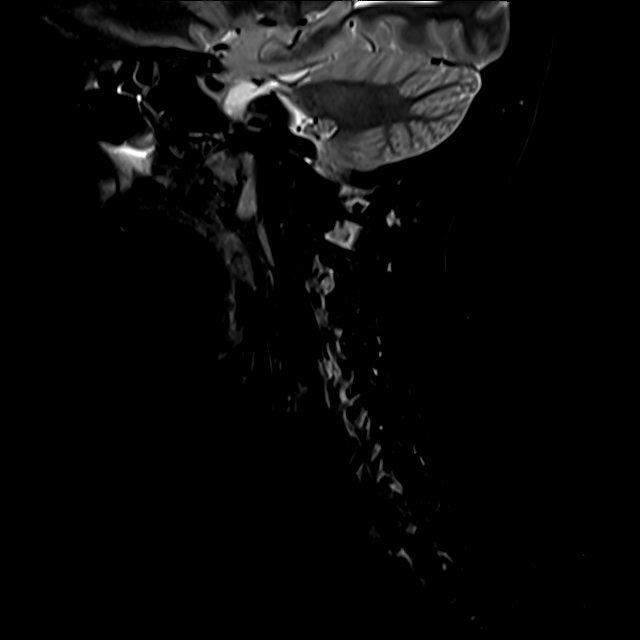
[im 8/15]
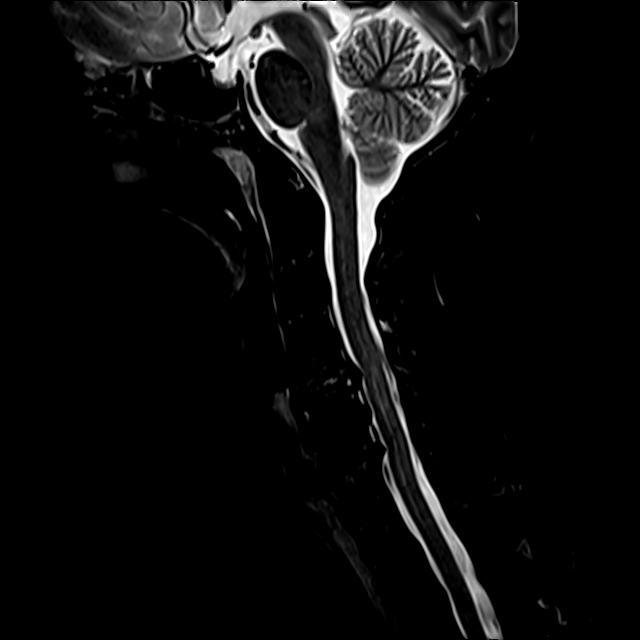
[im 12/15]
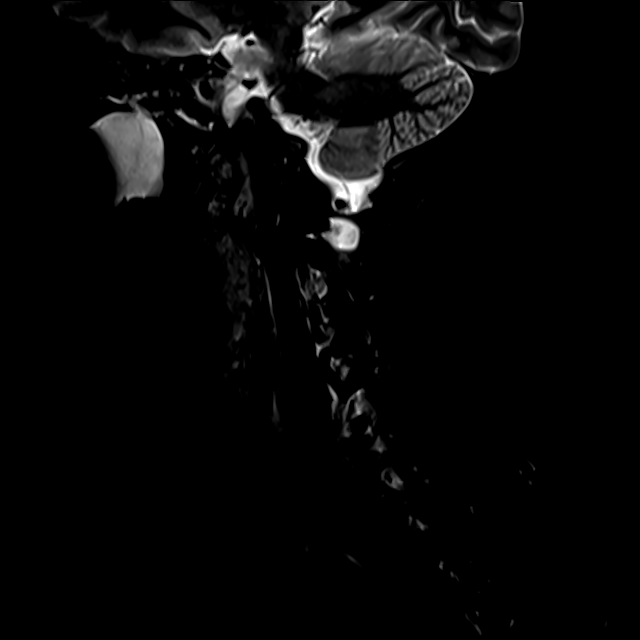

[Series 8: T2 · axial · 3.0mm · 0.50mm/px · z∈[-48,+34]mm · 9 of 27 slices shown (2 of 2)]
[im 1/27]
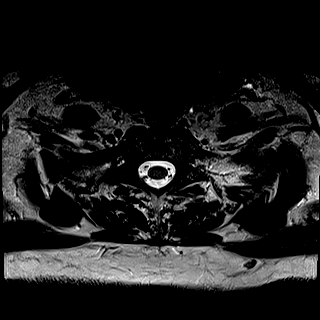
[im 5/27]
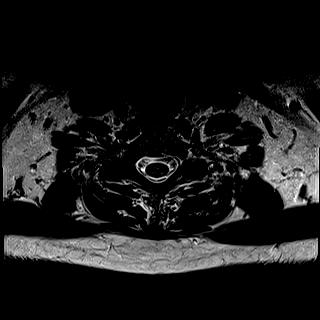
[im 9/27]
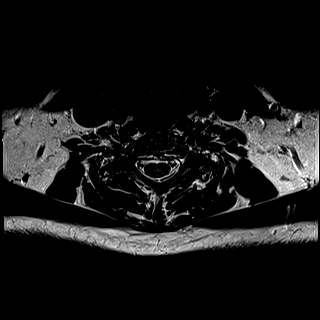
[im 11/27]
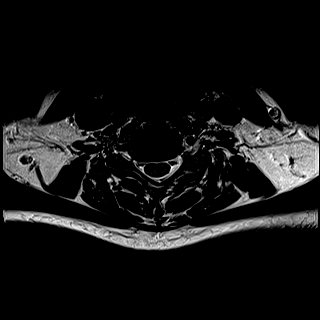
[im 14/27]
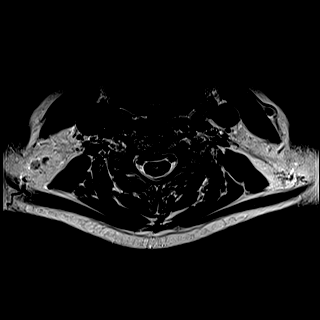
[im 16/27]
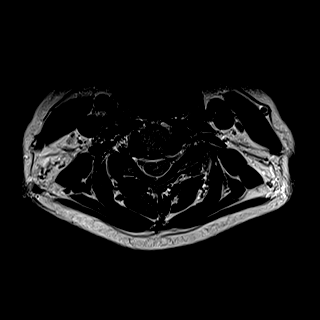
[im 18/27]
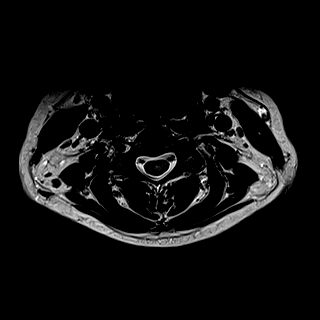
[im 22/27]
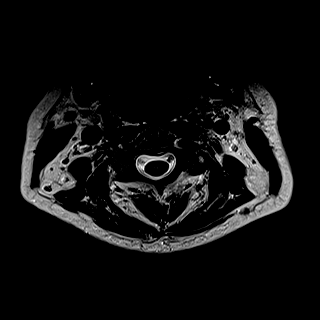
[im 27/27]
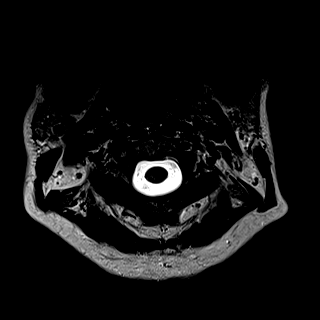

[28 of 48 positions shown; findings below may reference images not displayed]

FINDINGS: Mild intermittent motion degradation.

Alignment: Straightening of the expected cervical lordosis. Trace
C3-C4 grade 1 anterolisthesis.

Vertebrae: Vertebral body height is maintained. Multilevel
degenerative endplate irregularity and mixed degenerative endplate
marrow signal. This includes moderate degenerative endplate edema at
C4-C5 and mild degenerative endplate edema at C6-C7.

Cord: No spinal cord signal abnormality.

Posterior Fossa, vertebral arteries, paraspinal tissues:
Incidentally noted partially empty sella turcica. No abnormality
identified within included portions of the posterior fossa.
Extensive partial T2 hyperintense opacification of the left greater
than right maxillary sinuses. Flow voids preserved within the imaged
cervical vertebral arteries. Paraspinal soft tissues within normal
limits.

Disc levels:

Unless otherwise stated, the level by level findings below have not
significantly changed since prior MRI 08/10/2011.

Progressive multilevel disc degeneration. Most notably, there is now
moderate/advanced disc degeneration at C4-C5 and moderate disc
degeneration at C5-C6 and C6-C7.

C2-C3: No significant disc herniation or stenosis.

C3-C4: Trace C3-C4 grade 1 anterolisthesis. Mild uncinate
hypertrophy (greater on the right. Minimal facet hypertrophy. No
significant spinal canal stenosis. Mild left neural foraminal
narrowing.

C4-C5: Progressive posterior disc osteophyte complex with right
greater than left disc osteophyte ridge/uncinate hypertrophy. Mild
facet hypertrophy. Progressive mild spinal canal stenosis. The disc
osteophyte complex now contacts and mildly flattens the ventral
spinal cord. Progressive bilateral neural foraminal narrowing
(moderate to moderately severe right, mild left).

C5-C6: Progressive posterior disc osteophyte complex with bilateral
disc osteophyte ridge/uncinate hypertrophy. Mild relative spinal
canal narrowing without cord mass effect. Progressive moderate to
moderately severe right neural foraminal narrowing.

C6-C7: Progressive posterior disc osteophyte complex with bilateral
disc osteophyte ridge/uncinate hypertrophy. Progressive mild spinal
canal stenosis. The posterior disc osteophyte complex now contacts
and mildly flattens the ventral spinal cord. Unchanged mild right
neural foraminal narrowing. Progressive moderate to moderately
severe left neural foraminal narrowing.

C7-T1: No significant disc herniation or stenosis.
IMPRESSION: Cervical spondylosis as outlined with findings most notably as
follows.

At C4-C5, there is progressive moderate/advanced disc degeneration.
A progressive posterior disc osteophyte complex contributes to
progressive multifactorial mild spinal canal stenosis. The disc
osteophyte complex contacts and mildly flattens the ventral spinal
cord. Progressive bilateral neural foraminal narrowing (moderate to
moderately severe right, mild left).

At C5-C6, there is progressive moderate disc degeneration. A
progressive posterior disc osteophyte complex contributes to
progressive mild relative spinal canal narrowing. Progressive
moderate to moderately severe right neural foraminal narrowing.

At C6-C7, there is progressive moderate disc degeneration. A
progressive posterior disc osteophyte complex contributes to
progressive mild spinal canal stenosis. The posterior disc
osteophyte complex contacts and mildly flattens the ventral spinal
cord. Progressive moderate to moderately severe left neural
foraminal narrowing.

Trace C3-C4 grade 1 anterolisthesis.

There is extensive nonspecific T2 hyperintense opacifications of the
visualized maxillary sinuses (greater on the left). Findings are
nonspecific but may reflect the presence of secretions or large
mucous retention cysts.
